# Patient Record
Sex: Female | Born: 1960 | Race: White | Hispanic: No | Smoking: Current every day smoker
Health system: Southern US, Community
[De-identification: ages and names within clinical notes are randomized; demographics above are authoritative.]

## PROBLEM LIST (undated history)

## (undated) DIAGNOSIS — B1920 Unspecified viral hepatitis C without hepatic coma: Secondary | ICD-10-CM

## (undated) DIAGNOSIS — F419 Anxiety disorder, unspecified: Secondary | ICD-10-CM

## (undated) DIAGNOSIS — F4310 Post-traumatic stress disorder, unspecified: Secondary | ICD-10-CM

## (undated) DIAGNOSIS — F32A Depression, unspecified: Secondary | ICD-10-CM

## (undated) DIAGNOSIS — M5030 Other cervical disc degeneration, unspecified cervical region: Secondary | ICD-10-CM

## (undated) DIAGNOSIS — K7460 Unspecified cirrhosis of liver: Secondary | ICD-10-CM

## (undated) DIAGNOSIS — F329 Major depressive disorder, single episode, unspecified: Secondary | ICD-10-CM

## (undated) DIAGNOSIS — M549 Dorsalgia, unspecified: Secondary | ICD-10-CM

## (undated) DIAGNOSIS — G4700 Insomnia, unspecified: Secondary | ICD-10-CM

## (undated) HISTORY — PX: APPENDECTOMY: SHX54

## (undated) HISTORY — PX: BREAST SURGERY: SHX581

## (undated) HISTORY — PX: CHOLECYSTECTOMY: SHX55

---

## 2015-08-03 ENCOUNTER — Encounter (HOSPITAL_BASED_OUTPATIENT_CLINIC_OR_DEPARTMENT_OTHER): Payer: Self-pay | Admitting: Emergency Medicine

## 2015-08-03 ENCOUNTER — Emergency Department (HOSPITAL_BASED_OUTPATIENT_CLINIC_OR_DEPARTMENT_OTHER): Payer: Medicaid Other

## 2015-08-03 ENCOUNTER — Emergency Department (HOSPITAL_BASED_OUTPATIENT_CLINIC_OR_DEPARTMENT_OTHER)
Admission: EM | Admit: 2015-08-03 | Discharge: 2015-08-03 | Disposition: A | Discharge status: Home / Self Care | Payer: Medicaid Other | Attending: Emergency Medicine | Admitting: Emergency Medicine

## 2015-08-03 DIAGNOSIS — Z79899 Other long term (current) drug therapy: Secondary | ICD-10-CM | POA: Insufficient documentation

## 2015-08-03 DIAGNOSIS — J441 Chronic obstructive pulmonary disease with (acute) exacerbation: Secondary | ICD-10-CM

## 2015-08-03 DIAGNOSIS — F17200 Nicotine dependence, unspecified, uncomplicated: Secondary | ICD-10-CM | POA: Diagnosis not present

## 2015-08-03 DIAGNOSIS — Z8719 Personal history of other diseases of the digestive system: Secondary | ICD-10-CM | POA: Diagnosis not present

## 2015-08-03 DIAGNOSIS — R05 Cough: Secondary | ICD-10-CM | POA: Diagnosis present

## 2015-08-03 DIAGNOSIS — G4700 Insomnia, unspecified: Secondary | ICD-10-CM | POA: Diagnosis not present

## 2015-08-03 DIAGNOSIS — Z8619 Personal history of other infectious and parasitic diseases: Secondary | ICD-10-CM | POA: Diagnosis not present

## 2015-08-03 DIAGNOSIS — Z8739 Personal history of other diseases of the musculoskeletal system and connective tissue: Secondary | ICD-10-CM | POA: Diagnosis not present

## 2015-08-03 HISTORY — DX: Unspecified cirrhosis of liver: K74.60

## 2015-08-03 HISTORY — DX: Insomnia, unspecified: G47.00

## 2015-08-03 HISTORY — DX: Dorsalgia, unspecified: M54.9

## 2015-08-03 HISTORY — DX: Unspecified viral hepatitis C without hepatic coma: B19.20

## 2015-08-03 LAB — CBC
HEMATOCRIT: 40.1 % (ref 36.0–46.0)
HEMOGLOBIN: 13.6 g/dL (ref 12.0–15.0)
MCH: 31.4 pg (ref 26.0–34.0)
MCHC: 33.9 g/dL (ref 30.0–36.0)
MCV: 92.6 fL (ref 78.0–100.0)
PLATELETS: 91 10*3/uL — AB (ref 150–400)
RBC: 4.33 MIL/uL (ref 3.87–5.11)
RDW: 13.3 % (ref 11.5–15.5)
WBC: 4.1 10*3/uL (ref 4.0–10.5)

## 2015-08-03 LAB — COMPREHENSIVE METABOLIC PANEL
ALT: 84 U/L — ABNORMAL HIGH (ref 14–54)
AST: 149 U/L — ABNORMAL HIGH (ref 15–41)
Albumin: 3.7 g/dL (ref 3.5–5.0)
Alkaline Phosphatase: 126 U/L (ref 38–126)
Anion gap: 5 (ref 5–15)
BUN: 19 mg/dL (ref 6–20)
CO2: 25 mmol/L (ref 22–32)
Calcium: 9.2 mg/dL (ref 8.9–10.3)
Chloride: 108 mmol/L (ref 101–111)
Creatinine, Ser: 0.86 mg/dL (ref 0.44–1.00)
GFR calc Af Amer: >60 mL/min (ref >60)
GFR calc non Af Amer: >60 mL/min (ref >60)
Glucose, Bld: 100 mg/dL — ABNORMAL HIGH (ref 65–99)
Potassium: 3.6 mmol/L (ref 3.5–5.1)
Sodium: 138 mmol/L (ref 135–145)
Total Bilirubin: 0.6 mg/dL (ref 0.3–1.2)
Total Protein: 8 g/dL (ref 6.5–8.1)

## 2015-08-03 MED ORDER — AZITHROMYCIN 250 MG PO TABS: ORAL_TABLET | ORAL | Status: DC

## 2015-08-03 MED ORDER — KETOROLAC TROMETHAMINE 30 MG/ML IJ SOLN
30.0000 mg | Freq: Once | INTRAMUSCULAR | Status: AC
  Administered 2015-08-03: 30 mg via INTRAVENOUS
  Filled 2015-08-03: qty 1

## 2015-08-03 MED ORDER — KETOROLAC TROMETHAMINE 15 MG/ML IJ SOLN
15.0000 mg | Freq: Once | INTRAMUSCULAR | Status: AC
  Administered 2015-08-03: 15 mg via INTRAVENOUS
  Filled 2015-08-03: qty 1

## 2015-08-03 MED ORDER — DEXAMETHASONE SODIUM PHOSPHATE 10 MG/ML IJ SOLN
10.0000 mg | Freq: Once | INTRAMUSCULAR | Status: AC
  Administered 2015-08-03: 10 mg via INTRAVENOUS
  Filled 2015-08-03: qty 1

## 2015-08-03 NOTE — ED Notes (Signed)
Patient states that she had had SOb and chest pain x 2 week. Reports that she thinks she may be getting diabetes because she likes candy a lot and she had been dizzy and lightheaded recently. Patient has gained over 70 lbs in 6 months, and has a large distended abdominal area that she reports is getting worse. The patient also reports that she is having lower back pain continously.

## 2015-08-03 NOTE — ED Notes (Signed)
MD at bedside. 

## 2015-08-03 NOTE — ED Notes (Signed)
Pt given d/c instructions as per chart. Rx x 1. Verbalizes understanding. No questions. 

## 2015-08-03 NOTE — ED Provider Notes (Signed)
CSN: [id number]     Arrival date & time 08/03/15  1716 History   None    Chief Complaint  Patient presents with  . Cough      HPI   55 year old female presenting for cough for approximately 1-2 months. Has been more productive over the last 2 weeks. She reports history of COPD for which she is on 3 L of oxygen at home. She takes no medication for this. She reports shortness of breath associated. She denies chest pain. Her Patient denies fevers, nausea, vomiting, diarrhea, headache, weakness. Otherwise patient has a history of chronic back pain. She feels that her back pain has been exacerbated by coughing. She typically has low back pain that feels that it has spread superiorly to her ribs. She thinks she broke a rib from coughing   Past Medical History  Diagnosis Date  . Back pain   . Insomnia   . Hepatitis C   . Cirrhosis [facility])    Past Surgical History  Procedure Laterality Date  . Cholecystectomy    . Appendectomy    . Breast surgery     History reviewed. No pertinent family history. Social History  Substance Use Topics  . Smoking status: Current Every Day Smoker  . Smokeless tobacco: None  . Alcohol Use: No   OB History    No data available     Review of Systems  Constitutional: Negative.   HENT: Negative.   Eyes: Negative.   Respiratory: Positive for cough and shortness of breath. Negative for apnea, choking, chest tightness, wheezing and stridor.   Cardiovascular: Negative.   Gastrointestinal: Negative.   Genitourinary: Negative.   Skin: Negative.       Allergies  Review of patient's allergies indicates no known allergies.  Home Medications   Prior to Admission medications   Medication Sig Start Date End Date Taking? Authorizing Provider  gabapentin (NEURONTIN) 300 MG capsule Take 300 mg by mouth 3 (three) times daily.   Yes Historical Provider, MD  trazodone (DESYREL) 300 MG tablet Take 300 mg by mouth at bedtime.   Yes Historical Provider, MD   azithromycin (ZITHROMAX) 250 MG tablet Take 500 mg on the first day of treatment, then 250 mg daily for 4 days 08/03/15   Bonney Aid, MD   BP 125/82 mmHg  Pulse 81  Temp(Src) 98.1 F (36.7 C) (Oral)  Resp 18  Ht  (1.626 m)  Wt 95.255 kg  BMI 36.03 kg/m2  SpO2 100% Physical Exam  Constitutional: She is oriented to person, place, and time. She appears well-developed.  HENT:  Head: Normocephalic and atraumatic.  Eyes: Conjunctivae and EOM are normal. Pupils are equal, round, and reactive to light.  Neck: Normal range of motion.  Cardiovascular: Normal rate, regular rhythm and normal heart sounds.   Pulmonary/Chest: Effort normal and breath sounds normal. No respiratory distress.  Abdominal: Soft. Bowel sounds are normal. She exhibits no distension. There is no tenderness.  Musculoskeletal: Normal range of motion.  Neurological: She is alert and oriented to person, place, and time.  Skin: Skin is warm and dry.    ED Course  Procedures (including critical care time) Labs Review Labs Reviewed  CBC - Abnormal; Notable for the following:    Platelets 91 (*)    All other components within normal limits  COMPREHENSIVE METABOLIC PANEL - Abnormal; Notable for the following:    Glucose, Bld 100 (*)    AST 149 (*)    ALT 84 (*)  All other components within normal limits    Imaging Review Dg Chest 2 View  08/03/2015  CLINICAL DATA:  Cough for 2 months, increased severity over the past 2 weeks EXAM: CHEST  2 VIEW COMPARISON:  None. FINDINGS: The heart size and mediastinal contours are within normal limits. Both lungs are clear. The visualized skeletal structures are unremarkable. IMPRESSION: No active cardiopulmonary disease. Electronically Signed   By: Alcide CleverMark  Lukens M.D.   On: 08/03/2015 18:54   I have personally reviewed and evaluated these images and lab results as part of my medical decision-making.   EKG Interpretation None      MDM   Final diagnoses:  Chronic  obstructive pulmonary disease with acute exacerbation (HCC)    55 y/o with history consistent with COPD exacerbation. Differential includes flu, pneumonia. However, Chest xray was negative for consolidation and she has not had fevers. History of cough with sputum production in setting of COPD history makes COPD exacerbation per GOLD criteria. Although she did endorse dyspnea hr oxygen saturations were normal on room air. However, will treat for likely COPD exacerbation with decadron x1 and a 5 day course of azithromycin. Return precautions were discussed and she was advised to follow with PCP in the next week   Ayah Cozzolino A. Kennon RoundsHaney MD, MS Family Medicine Resident PGY-2 Pager [phone]     Bonney AidAlyssa A Rayfield Beem, MD 08/03/15 [id number]  Nelva Nayobert Beaton, MD 08/04/15 [phone]

## 2015-08-03 NOTE — Discharge Instructions (Signed)
Your diagnosed with COPD exacerbation. Please take antibiotics as prescribed. If you have worsening shortness of breath, chest pain, fevers turned emergency department for evaluation Please follow-up with your PCP within the next week

## 2016-01-10 ENCOUNTER — Encounter (HOSPITAL_COMMUNITY): Payer: Self-pay | Admitting: *Deleted

## 2016-01-10 ENCOUNTER — Emergency Department (HOSPITAL_COMMUNITY)
Admission: EM | Admit: 2016-01-10 | Discharge: 2016-01-11 | Disposition: A | Discharge status: Other Acute Inpatient Hospital | Payer: Medicaid Other | Attending: Emergency Medicine | Admitting: Emergency Medicine

## 2016-01-10 DIAGNOSIS — X789XXA Intentional self-harm by unspecified sharp object, initial encounter: Secondary | ICD-10-CM | POA: Insufficient documentation

## 2016-01-10 DIAGNOSIS — T07XXXA Unspecified multiple injuries, initial encounter: Secondary | ICD-10-CM

## 2016-01-10 DIAGNOSIS — T1491 Suicide attempt: Secondary | ICD-10-CM | POA: Diagnosis not present

## 2016-01-10 DIAGNOSIS — Z23 Encounter for immunization: Secondary | ICD-10-CM | POA: Diagnosis not present

## 2016-01-10 DIAGNOSIS — Y999 Unspecified external cause status: Secondary | ICD-10-CM | POA: Diagnosis not present

## 2016-01-10 DIAGNOSIS — Z046 Encounter for general psychiatric examination, requested by authority: Secondary | ICD-10-CM | POA: Diagnosis present

## 2016-01-10 DIAGNOSIS — Y929 Unspecified place or not applicable: Secondary | ICD-10-CM | POA: Insufficient documentation

## 2016-01-10 DIAGNOSIS — F17200 Nicotine dependence, unspecified, uncomplicated: Secondary | ICD-10-CM | POA: Insufficient documentation

## 2016-01-10 DIAGNOSIS — Y939 Activity, unspecified: Secondary | ICD-10-CM | POA: Diagnosis not present

## 2016-01-10 DIAGNOSIS — Z7982 Long term (current) use of aspirin: Secondary | ICD-10-CM | POA: Diagnosis not present

## 2016-01-10 DIAGNOSIS — R45851 Suicidal ideations: Secondary | ICD-10-CM

## 2016-01-10 DIAGNOSIS — Z79899 Other long term (current) drug therapy: Secondary | ICD-10-CM | POA: Insufficient documentation

## 2016-01-10 DIAGNOSIS — S50812A Abrasion of left forearm, initial encounter: Secondary | ICD-10-CM | POA: Diagnosis not present

## 2016-01-10 DIAGNOSIS — T1491XA Suicide attempt, initial encounter: Secondary | ICD-10-CM

## 2016-01-10 HISTORY — DX: Major depressive disorder, single episode, unspecified: F32.9

## 2016-01-10 HISTORY — DX: Post-traumatic stress disorder, unspecified: F43.10

## 2016-01-10 HISTORY — DX: Other cervical disc degeneration, unspecified cervical region: M50.30

## 2016-01-10 HISTORY — DX: Depression, unspecified: F32.A

## 2016-01-10 HISTORY — DX: Anxiety disorder, unspecified: F41.9

## 2016-01-10 LAB — CBC WITH DIFFERENTIAL/PLATELET
BASOS PCT: 0 %
Basophils Absolute: 0 10*3/uL (ref 0.0–0.1)
Eosinophils Absolute: 0.2 10*3/uL (ref 0.0–0.7)
Eosinophils Relative: 4 %
HEMATOCRIT: 40.3 % (ref 36.0–46.0)
HEMOGLOBIN: 13.7 g/dL (ref 12.0–15.0)
LYMPHS ABS: 1.5 10*3/uL (ref 0.7–4.0)
LYMPHS PCT: 23 %
MCH: 31.2 pg (ref 26.0–34.0)
MCHC: 34 g/dL (ref 30.0–36.0)
MCV: 91.8 fL (ref 78.0–100.0)
MONO ABS: 0.7 10*3/uL (ref 0.1–1.0)
MONOS PCT: 11 %
NEUTROS ABS: 4 10*3/uL (ref 1.7–7.7)
NEUTROS PCT: 62 %
Platelets: 97 10*3/uL — ABNORMAL LOW (ref 150–400)
RBC: 4.39 MIL/uL (ref 3.87–5.11)
RDW: 13.5 % (ref 11.5–15.5)
WBC: 6.4 10*3/uL (ref 4.0–10.5)

## 2016-01-10 LAB — RAPID URINE DRUG SCREEN, HOSP PERFORMED
Amphetamines: NOT DETECTED
BARBITURATES: NOT DETECTED
Benzodiazepines: NOT DETECTED
COCAINE: NOT DETECTED
Opiates: NOT DETECTED
TETRAHYDROCANNABINOL: NOT DETECTED

## 2016-01-10 LAB — BASIC METABOLIC PANEL
Anion gap: 8 (ref 5–15)
BUN: 11 mg/dL (ref 6–20)
CHLORIDE: 109 mmol/L (ref 101–111)
CO2: 20 mmol/L — AB (ref 22–32)
CREATININE: 1.1 mg/dL — AB (ref 0.44–1.00)
Calcium: 9.1 mg/dL (ref 8.9–10.3)
GFR calc non Af Amer: 55 mL/min — ABNORMAL LOW (ref >60)
GLUCOSE: 115 mg/dL — AB (ref 65–99)
Potassium: 3.2 mmol/L — ABNORMAL LOW (ref 3.5–5.1)
Sodium: 137 mmol/L (ref 135–145)

## 2016-01-10 LAB — ETHANOL: Alcohol, Ethyl (B): 137 mg/dL — ABNORMAL HIGH (ref <5)

## 2016-01-10 MED ORDER — LORAZEPAM 1 MG PO TABS
1.0000 mg | ORAL_TABLET | Freq: Once | ORAL | Status: AC
  Administered 2016-01-10: 1 mg via ORAL

## 2016-01-10 MED ORDER — LORAZEPAM 1 MG PO TABS
ORAL_TABLET | ORAL | Status: AC
  Filled 2016-01-10: qty 1

## 2016-01-10 MED ORDER — ALUM & MAG HYDROXIDE-SIMETH 200-200-20 MG/5ML PO SUSP: 30.0000 mL | ORAL | Status: DC | PRN

## 2016-01-10 MED ORDER — GABAPENTIN 300 MG PO CAPS
300.0000 mg | ORAL_CAPSULE | Freq: Three times a day (TID) | ORAL | Status: DC
  Administered 2016-01-11 (×2): 300 mg via ORAL
  Filled 2016-01-10 (×2): qty 1

## 2016-01-10 MED ORDER — TRAZODONE HCL 50 MG PO TABS
300.0000 mg | ORAL_TABLET | Freq: Every day | ORAL | Status: DC
  Administered 2016-01-11: 300 mg via ORAL
  Filled 2016-01-10: qty 6

## 2016-01-10 MED ORDER — TETANUS-DIPHTH-ACELL PERTUSSIS 5-2.5-18.5 LF-MCG/0.5 IM SUSP
0.5000 mL | Freq: Once | INTRAMUSCULAR | Status: AC
  Administered 2016-01-10: 0.5 mL via INTRAMUSCULAR
  Filled 2016-01-10: qty 0.5

## 2016-01-10 MED ORDER — IBUPROFEN 400 MG PO TABS
600.0000 mg | ORAL_TABLET | Freq: Three times a day (TID) | ORAL | Status: DC | PRN
  Administered 2016-01-11: 600 mg via ORAL
  Filled 2016-01-10: qty 2

## 2016-01-10 MED ORDER — NICOTINE 21 MG/24HR TD PT24
21.0000 mg | MEDICATED_PATCH | Freq: Every day | TRANSDERMAL | Status: DC | PRN
  Administered 2016-01-11: 21 mg via TRANSDERMAL
  Filled 2016-01-10: qty 1

## 2016-01-10 MED ORDER — LORAZEPAM 1 MG PO TABS: 1.0000 mg | ORAL_TABLET | Freq: Three times a day (TID) | ORAL | Status: DC | PRN
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
## 2016-01-10 NOTE — ED Notes (Signed)
Tele psych in progress at this time. 
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
## 2016-01-10 NOTE — ED Notes (Addendum)
Pt cleaned up in room, patient repeated a few times that she's "glad she didn't have a knife tonight because..." and then trails off and will not finish sentence.

## 2016-01-11 ENCOUNTER — Inpatient Hospital Stay: Admission: EM | Admit: 2016-01-11 | Payer: Medicaid Other | Source: Intra-hospital | Admitting: Psychiatry

## 2016-01-11 ENCOUNTER — Encounter: Payer: Self-pay | Admitting: Psychiatry

## 2016-01-11 DIAGNOSIS — F17200 Nicotine dependence, unspecified, uncomplicated: Secondary | ICD-10-CM | POA: Diagnosis present

## 2016-01-11 DIAGNOSIS — F332 Major depressive disorder, recurrent severe without psychotic features: Secondary | ICD-10-CM | POA: Diagnosis present

## 2016-01-11 DIAGNOSIS — R45851 Suicidal ideations: Secondary | ICD-10-CM

## 2016-01-11 DIAGNOSIS — F1020 Alcohol dependence, uncomplicated: Secondary | ICD-10-CM | POA: Diagnosis present

## 2016-01-11 MED ORDER — LORAZEPAM 1 MG PO TABS: 0.0000 mg | ORAL_TABLET | Freq: Two times a day (BID) | ORAL | Status: DC

## 2016-01-11 MED ORDER — LORAZEPAM 1 MG PO TABS
0.0000 mg | ORAL_TABLET | Freq: Four times a day (QID) | ORAL | Status: DC
  Administered 2016-01-11: 2 mg via ORAL
  Administered 2016-01-11: 1 mg via ORAL
  Filled 2016-01-11: qty 2
  Filled 2016-01-11: qty 1

## 2016-01-11 MED ORDER — POTASSIUM CHLORIDE CRYS ER 20 MEQ PO TBCR
40.0000 meq | EXTENDED_RELEASE_TABLET | Freq: Once | ORAL | Status: AC
  Administered 2016-01-11: 40 meq via ORAL
  Filled 2016-01-11: qty 2

## 2016-01-11 MED ORDER — THIAMINE HCL 100 MG/ML IJ SOLN: 100.0000 mg | Freq: Every day | INTRAMUSCULAR | Status: DC

## 2016-01-11 MED ORDER — VITAMIN B-1 100 MG PO TABS
100.0000 mg | ORAL_TABLET | Freq: Every day | ORAL | Status: DC
  Administered 2016-01-11: 100 mg via ORAL
  Filled 2016-01-11: qty 1

## 2016-01-11 NOTE — Progress Notes (Signed)
[facility] by Dr. Otelia SanteeFarrah. Per Albin Fellingarla in intake, bed available 15:00 today. Requests that report be called at [phone] once transport has arrived for pt (pt under IVC).  Ilean SkillMeghan Geneva Pallas, MSW, LCSW Clinical Social Work, Disposition  01/11/2016 [phone]

## 2016-01-11 NOTE — ED Notes (Signed)
TTS at bedside. 

## 2016-01-11 NOTE — ED Notes (Signed)
Pt ambulatory. Pt walked to bathroom with no difficulties noted.

## 2016-01-11 NOTE — ED Notes (Signed)
Patient asleep at this time. [facility] sitting with patient.

## 2016-01-11 NOTE — BH Assessment (Addendum)
Pt presented as drowsy during reassement. Clinician informed by RN that pt has been administered ativan. Pt continues to endorse suicidal ideation with intent. Pt denies homicidal ideation. Pt reports auditory hallucinations without command of a voice calling her name. Clinician discussed inpatient admission process with pt.   Per Fransisca KaufmannLaura Davis, PA pt continues to meet criteria for inpatient admission.

## 2016-01-11 NOTE — ED Notes (Signed)
Pt awake at this time. Pt has breakfast tray at bedside.
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
## 2016-01-11 NOTE — ED Notes (Signed)
Patient reports that the reason she ran out of Lexapro is because " I took them all" when asked if she took them all at the same time she stated "yea, but it didn't work".

## 2016-01-11 NOTE — ED Provider Notes (Signed)
Patient has been in the ED for 24 hours. Nurses report patient is starting to have withdrawal from alcohol. They are requesting CIWA orders, which I have written.   Devoria AlbeIva Bryndan Bilyk, MD, Concha PyoFACEP    Britne Borelli, MD 01/11/16 [phone]

## 2016-01-11 NOTE — ED Notes (Signed)
Into check on patient, patient shaking, states she feels likes she is starting to go through withdrawals from alchol. Dr. Lynelle DoctorKnapp informed.

## 2016-01-11 NOTE — ED Notes (Signed)
Pt denies si/hi/halluciantions or hearing voices at thsi time

## 2016-02-14 ENCOUNTER — Encounter (HOSPITAL_COMMUNITY): Payer: Self-pay | Admitting: Cardiology

## 2016-02-14 ENCOUNTER — Emergency Department (HOSPITAL_COMMUNITY)
Admission: EM | Admit: 2016-02-14 | Discharge: 2016-02-14 | Disposition: A | Discharge status: Other Acute Inpatient Hospital | Payer: Medicaid Other | Attending: Emergency Medicine | Admitting: Emergency Medicine

## 2016-02-14 DIAGNOSIS — F17200 Nicotine dependence, unspecified, uncomplicated: Secondary | ICD-10-CM | POA: Insufficient documentation

## 2016-02-14 DIAGNOSIS — Z7982 Long term (current) use of aspirin: Secondary | ICD-10-CM | POA: Insufficient documentation

## 2016-02-14 DIAGNOSIS — F4310 Post-traumatic stress disorder, unspecified: Secondary | ICD-10-CM | POA: Insufficient documentation

## 2016-02-14 DIAGNOSIS — Z79899 Other long term (current) drug therapy: Secondary | ICD-10-CM | POA: Diagnosis not present

## 2016-02-14 DIAGNOSIS — R45851 Suicidal ideations: Secondary | ICD-10-CM

## 2016-02-14 DIAGNOSIS — F333 Major depressive disorder, recurrent, severe with psychotic symptoms: Secondary | ICD-10-CM | POA: Insufficient documentation

## 2016-02-14 LAB — COMPREHENSIVE METABOLIC PANEL
ALBUMIN: 3.5 g/dL (ref 3.5–5.0)
ALT: 34 U/L (ref 14–54)
ANION GAP: 6 (ref 5–15)
AST: 73 U/L — ABNORMAL HIGH (ref 15–41)
Alkaline Phosphatase: 166 U/L — ABNORMAL HIGH (ref 38–126)
BILIRUBIN TOTAL: 0.5 mg/dL (ref 0.3–1.2)
BUN: 16 mg/dL (ref 6–20)
CALCIUM: 9 mg/dL (ref 8.9–10.3)
CO2: 25 mmol/L (ref 22–32)
Chloride: 109 mmol/L (ref 101–111)
Creatinine, Ser: 0.86 mg/dL (ref 0.44–1.00)
Glucose, Bld: 143 mg/dL — ABNORMAL HIGH (ref 65–99)
POTASSIUM: 3.6 mmol/L (ref 3.5–5.1)
Sodium: 140 mmol/L (ref 135–145)
TOTAL PROTEIN: 8.3 g/dL — AB (ref 6.5–8.1)

## 2016-02-14 LAB — CBC WITH DIFFERENTIAL/PLATELET
BASOS ABS: 0 10*3/uL (ref 0.0–0.1)
BASOS PCT: 0 %
EOS ABS: 0.1 10*3/uL (ref 0.0–0.7)
Eosinophils Relative: 2 %
HEMATOCRIT: 39.7 % (ref 36.0–46.0)
HEMOGLOBIN: 13.7 g/dL (ref 12.0–15.0)
Lymphocytes Relative: 31 %
Lymphs Abs: 1.6 10*3/uL (ref 0.7–4.0)
MCH: 32.9 pg (ref 26.0–34.0)
MCHC: 34.5 g/dL (ref 30.0–36.0)
MCV: 95.4 fL (ref 78.0–100.0)
Monocytes Absolute: 0.6 10*3/uL (ref 0.1–1.0)
Monocytes Relative: 12 %
NEUTROS ABS: 2.9 10*3/uL (ref 1.7–7.7)
NEUTROS PCT: 56 %
Platelets: 83 10*3/uL — ABNORMAL LOW (ref 150–400)
RBC: 4.16 MIL/uL (ref 3.87–5.11)
RDW: 15.1 % (ref 11.5–15.5)
WBC: 5.2 10*3/uL (ref 4.0–10.5)

## 2016-02-14 LAB — ACETAMINOPHEN LEVEL: Acetaminophen (Tylenol), Serum: <10 ug/mL — ABNORMAL LOW (ref 10–30)

## 2016-02-14 LAB — RAPID URINE DRUG SCREEN, HOSP PERFORMED
Amphetamines: NOT DETECTED
BARBITURATES: NOT DETECTED
BENZODIAZEPINES: POSITIVE — AB
COCAINE: NOT DETECTED
OPIATES: NOT DETECTED
TETRAHYDROCANNABINOL: NOT DETECTED

## 2016-02-14 LAB — ETHANOL: ALCOHOL ETHYL (B): 172 mg/dL — AB (ref <5)

## 2016-02-14 LAB — SALICYLATE LEVEL: Salicylate Lvl: <4 mg/dL (ref 2.8–30.0)

## 2016-02-14 MED ORDER — LORAZEPAM 2 MG/ML IJ SOLN: 1.0000 mg | Freq: Once | INTRAMUSCULAR | Status: DC

## 2016-02-14 MED ORDER — ZOLPIDEM TARTRATE 5 MG PO TABS
5.0000 mg | ORAL_TABLET | Freq: Every evening | ORAL | Status: DC | PRN
  Administered 2016-02-14: 5 mg via ORAL
  Filled 2016-02-14: qty 1

## 2016-02-14 MED ORDER — ALUM & MAG HYDROXIDE-SIMETH 200-200-20 MG/5ML PO SUSP
30.0000 mL | ORAL | Status: DC | PRN
  Administered 2016-02-14: 30 mL via ORAL
  Filled 2016-02-14: qty 30

## 2016-02-14 MED ORDER — LORAZEPAM 1 MG PO TABS
1.0000 mg | ORAL_TABLET | Freq: Once | ORAL | Status: AC
  Administered 2016-02-14: 1 mg via ORAL
  Filled 2016-02-14: qty 1

## 2016-02-14 MED ORDER — NICOTINE 21 MG/24HR TD PT24
21.0000 mg | MEDICATED_PATCH | Freq: Every day | TRANSDERMAL | Status: DC
  Administered 2016-02-14: 21 mg via TRANSDERMAL
  Filled 2016-02-14: qty 1

## 2016-02-14 MED ORDER — LORAZEPAM 1 MG PO TABS
0.0000 mg | ORAL_TABLET | Freq: Four times a day (QID) | ORAL | Status: DC
  Administered 2016-02-14: 1 mg via ORAL
  Filled 2016-02-14: qty 1

## 2016-02-14 MED ORDER — LORAZEPAM 1 MG PO TABS: 0.0000 mg | ORAL_TABLET | Freq: Two times a day (BID) | ORAL | Status: DC

## 2016-02-14 MED ORDER — ONDANSETRON HCL 4 MG PO TABS
4.0000 mg | ORAL_TABLET | Freq: Three times a day (TID) | ORAL | Status: DC | PRN
  Administered 2016-02-14: 4 mg via ORAL
  Filled 2016-02-14: qty 1

## 2016-02-14 MED ORDER — ALBUTEROL SULFATE HFA 108 (90 BASE) MCG/ACT IN AERS
2.0000 | INHALATION_SPRAY | Freq: Once | RESPIRATORY_TRACT | Status: AC
  Administered 2016-02-14: 2 via RESPIRATORY_TRACT
  Filled 2016-02-14: qty 6.7

## 2016-02-14 MED ORDER — IBUPROFEN 400 MG PO TABS: 600.0000 mg | ORAL_TABLET | Freq: Three times a day (TID) | ORAL | Status: DC | PRN

## 2016-02-14 MED ORDER — DIPHENHYDRAMINE HCL 25 MG PO CAPS
25.0000 mg | ORAL_CAPSULE | Freq: Once | ORAL | Status: AC
  Administered 2016-02-14: 25 mg via ORAL
  Filled 2016-02-14: qty 1

## 2016-02-14 NOTE — ED Provider Notes (Addendum)
[facility]ERG[facility]Y DEPT Provider Note   CSN: [id number] Arrival date & time: 02/14/16  1151     History   Chief Complaint Chief Complaint  Patient presents with  . V70.1    HPI Donna Blanchard is a 55 y.o. female.  HPI  The pt has hx of anxiety, depression, PTSD and as of 5 weeks ago was seen in the ED for a complaint of depression and suicidal thought with intent - she was sent to [facility] unit - she presents today after her granddaughter called 911 stating that she was having increased suicidal thoughts and some threats  She is not hear to corroborate this story from EMS at this time - the pt was also admitted 2 months ago at [facility] for suicidality. - she has had attempts in the past.  The pt denies that she is suicidal at this time, she is here voluntarily and states that she has not had intent today though she does endorse that she made some comments to her grand daughter earlier today.  She does endorse alcohol use last night and hears voices constantly but can't tell what they're saying  - refuses to give me any other information at this time.  Past Medical History:  Diagnosis Date  . Anxiety   . Back pain   . Cirrhosis (HCC)   . DDD (degenerative disc disease), cervical   . Depression   . Hepatitis C   . Insomnia   . PTSD (post-traumatic stress disorder)     Patient Active Problem List   Diagnosis Date Noted  . Major depressive disorder, recurrent severe without psychotic features (HCC) 01/11/2016  . Alcohol use disorder, moderate, dependence (HCC) 01/11/2016  . Suicidal ideation 01/11/2016  . Tobacco use disorder 01/11/2016    Past Surgical History:  Procedure Laterality Date  . APPENDECTOMY    . BREAST SURGERY    . CHOLECYSTECTOMY      OB History    No data available       Home Medications    Prior to Admission medications   Medication Sig Start Date End Date Taking? Authorizing Provider  albuterol (PROVENTIL) 2 MG tablet Take 2 mg by mouth 3  (three) times daily as needed for wheezing or shortness of breath.  12/09/15  Yes Historical Provider, MD  ALPRAZolam (XANAX) 0.25 MG tablet Take 0.25 mg by mouth daily. 12/09/15  Yes Historical Provider, MD  Aspirin-Salicylamide-Caffeine (BC HEADACHE POWDER PO) Take 1 Package by mouth daily as needed (back pain).   Yes Historical Provider, MD  escitalopram (LEXAPRO) 10 MG tablet Take 10 mg by mouth daily. 12/09/15  Yes Historical Provider, MD  gabapentin (NEURONTIN) 300 MG capsule Take 300 mg by mouth 3 (three) times daily.   Yes Historical Provider, MD  hydrOXYzine (VISTARIL) 50 MG capsule Take 50 mg by mouth 3 (three) times daily as needed. 12/09/15  Yes Historical Provider, MD  nicotine (NICODERM CQ - DOSED IN MG/24 HOURS) 21 mg/24hr patch Place 1 patch onto the skin daily. 12/09/15  Yes Historical Provider, MD  trazodone (DESYREL) 300 MG tablet Take 300 mg by mouth at bedtime.   Yes Historical Provider, MD    Family History History reviewed. No pertinent family history.  Social History Social History  Substance Use Topics  . Smoking status: Current Every Day Smoker  . Smokeless tobacco: Never Used  . Alcohol use Yes     Comment: 3 40s     Allergies   Review of patient's allergies  indicates no known allergies.   Review of Systems Review of Systems  All other systems reviewed and are negative.    Physical Exam Updated Vital Signs BP 112/83 (BP Location: Right Arm)   Pulse 96   Temp 97.6 F (36.4 C) (Oral)   Resp 17   Ht 5\' 3"  (1.6 m)   Wt 215 lb (97.5 kg)   SpO2 98%   BMI 38.09 kg/m   Physical Exam  Constitutional: She appears well-developed and well-nourished. No distress.  HENT:  Head: Normocephalic and atraumatic.  Mouth/Throat: Oropharynx is clear and moist. No oropharyngeal exudate.  Eyes: Conjunctivae and EOM are normal. Pupils are equal, round, and reactive to light. Right eye exhibits no discharge. Left eye exhibits no discharge. No scleral icterus.  Neck:  Normal range of motion. Neck supple. No JVD present. No thyromegaly present.  Cardiovascular: Normal rate, regular rhythm, normal heart sounds and intact distal pulses.  Exam reveals no gallop and no friction rub.   No murmur heard. Pulmonary/Chest: Effort normal and breath sounds normal. No respiratory distress. She has no wheezes. She has no rales.  Abdominal: Soft. Bowel sounds are normal. She exhibits no distension and no mass. There is no tenderness.  Musculoskeletal: Normal range of motion. She exhibits no edema or tenderness.  Lymphadenopathy:    She has no cervical adenopathy.  Neurological: She is alert. Coordination normal.  Skin: Skin is warm and dry. No rash noted. No erythema.  Psychiatric: She has a normal mood and affect. Her behavior is normal.  Mildly agitated - clear speech, no hallucinations, denies suicidality.  Nursing note and vitals reviewed.    ED Treatments / Results  Labs (all labs ordered are listed, but only abnormal results are displayed) Labs Reviewed  COMPREHENSIVE METABOLIC PANEL - Abnormal; Notable for the following:       Result Value   Glucose, Bld 143 (*)    Total Protein 8.3 (*)    AST 73 (*)    Alkaline Phosphatase 166 (*)    All other components within normal limits  ETHANOL - Abnormal; Notable for the following:    Alcohol, Ethyl (B) 172 (*)    All other components within normal limits  CBC WITH DIFFERENTIAL/PLATELET - Abnormal; Notable for the following:    Platelets 83 (*)    All other components within normal limits  URINE RAPID DRUG SCREEN, HOSP PERFORMED - Abnormal; Notable for the following:    Benzodiazepines POSITIVE (*)    All other components within normal limits  ACETAMINOPHEN LEVEL - Abnormal; Notable for the following:    Acetaminophen (Tylenol), Serum <10 (*)    All other components within normal limits  SALICYLATE LEVEL     Radiology No results found.  Procedures Procedures (including critical care  time)  Medications Ordered in ED Medications  ibuprofen (ADVIL,MOTRIN) tablet 600 mg (not administered)  nicotine (NICODERM CQ - dosed in mg/24 hours) patch 21 mg (21 mg Transdermal Patch Applied 02/14/16 1329)  ondansetron (ZOFRAN) tablet 4 mg (4 mg Oral Given 02/14/16 1327)  zolpidem (AMBIEN) tablet 5 mg (not administered)  alum & mag hydroxide-simeth (MAALOX/MYLANTA) 200-200-20 MG/5ML suspension 30 mL (not administered)     Initial Impression / Assessment and Plan / ED Course  I have reviewed the triage vital signs and the nursing notes.  Pertinent labs & imaging results that were available during my care of the patient were reviewed by me and considered in my medical decision making (see chart for details).  Clinical  Course  Comment By Time  Platelets are low consistent with prior lab values. Eber HongBrian Erland Vivas, MD 10/01 1441   ETOH is high - evidence of ongoing ETOH heavy use Eber HongBrian Ashe Graybeal, MD 10/01 1441   Waiting to hear from family, sitter ordered Labs ordered TTS consult ordered  Granddaugher stated to TTS that the pt had tried to walk into traffic / get gun and voiced suicidality today (while intoxicated).  IVC completed  Change of shift - care signed over to Dr. Jeraldine LootsLockwood pending TTS placement  Final Clinical Impressions(s) / ED Diagnoses   Final diagnoses:  None    New Prescriptions New Prescriptions   No medications on file     Eber HongBrian Angelicia Lessner, MD 02/14/16 1212    Eber HongBrian Everlee Quakenbush, MD 02/14/16 1527

## 2016-02-14 NOTE — ED Notes (Addendum)
Transfer [facility] given to Donna [facility]Sarah [facility].  Pt going to unit 300 bed 2.

## 2016-02-14 NOTE — ED Notes (Signed)
IVC papers were faxed.  Waiting on official papers to be served to patient.
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
## 2016-02-14 NOTE — ED Triage Notes (Signed)
Granddaughter called EMS .  States that patient was threatening to hurt herself.  Pt has some superficial healing lacerations to left forearm.   Pt denies any HI or SI upon arrival to the ED.

## 2016-02-14 NOTE — ED Notes (Signed)
[facility]

## 2016-02-14 NOTE — [facility] Counselor (Signed)
[facility]  Accepting is T. Melvyn NethLewis, FNP.  Attending is Dr. Jama Flavorsobos.  Pt may be transported at any time.

## 2016-02-14 NOTE — ED Notes (Signed)
Pt given some meds for nausea and vomiting and is now resting.

## 2016-02-14 NOTE — ED Notes (Signed)
Pt eating at this time. Pt is calm and cooperative now. Warm blanket given.

## 2016-02-14 NOTE — ED Notes (Signed)
Pt given zofram PO for nausea and vomiting.

## 2016-02-14 NOTE — [facility] Counselor (Signed)
Recommend inpatient; advised hospital to petition for IVC if Pt attempts to leave

## 2016-02-15 ENCOUNTER — Encounter (HOSPITAL_COMMUNITY): Payer: Self-pay

## 2016-02-15 ENCOUNTER — Inpatient Hospital Stay (HOSPITAL_COMMUNITY)
Admission: AD | Admit: 2016-02-15 | Discharge: 2016-02-18 | DRG: 897 | Disposition: A | Discharge status: Home / Self Care | Payer: Medicaid Other | Attending: Psychiatry | Admitting: Psychiatry

## 2016-02-15 DIAGNOSIS — Z79899 Other long term (current) drug therapy: Secondary | ICD-10-CM

## 2016-02-15 DIAGNOSIS — B1920 Unspecified viral hepatitis C without hepatic coma: Secondary | ICD-10-CM | POA: Diagnosis present

## 2016-02-15 DIAGNOSIS — Z9889 Other specified postprocedural states: Secondary | ICD-10-CM

## 2016-02-15 DIAGNOSIS — F1024 Alcohol dependence with alcohol-induced mood disorder: Secondary | ICD-10-CM | POA: Diagnosis present

## 2016-02-15 DIAGNOSIS — F419 Anxiety disorder, unspecified: Secondary | ICD-10-CM | POA: Diagnosis present

## 2016-02-15 DIAGNOSIS — F10229 Alcohol dependence with intoxication, unspecified: Secondary | ICD-10-CM | POA: Diagnosis present

## 2016-02-15 DIAGNOSIS — F10239 Alcohol dependence with withdrawal, unspecified: Secondary | ICD-10-CM | POA: Diagnosis not present

## 2016-02-15 DIAGNOSIS — F4312 Post-traumatic stress disorder, chronic: Secondary | ICD-10-CM | POA: Diagnosis present

## 2016-02-15 DIAGNOSIS — F1020 Alcohol dependence, uncomplicated: Secondary | ICD-10-CM | POA: Diagnosis present

## 2016-02-15 DIAGNOSIS — F17200 Nicotine dependence, unspecified, uncomplicated: Secondary | ICD-10-CM | POA: Diagnosis present

## 2016-02-15 DIAGNOSIS — F1994 Other psychoactive substance use, unspecified with psychoactive substance-induced mood disorder: Secondary | ICD-10-CM | POA: Diagnosis present

## 2016-02-15 DIAGNOSIS — G4700 Insomnia, unspecified: Secondary | ICD-10-CM | POA: Diagnosis present

## 2016-02-15 DIAGNOSIS — K7460 Unspecified cirrhosis of liver: Secondary | ICD-10-CM | POA: Diagnosis present

## 2016-02-15 MED ORDER — LORAZEPAM 1 MG PO TABS
1.0000 mg | ORAL_TABLET | Freq: Three times a day (TID) | ORAL | Status: AC
  Administered 2016-02-16 (×3): 1 mg via ORAL
  Filled 2016-02-15 (×4): qty 1

## 2016-02-15 MED ORDER — ALUM & MAG HYDROXIDE-SIMETH 200-200-20 MG/5ML PO SUSP: 30.0000 mL | ORAL | Status: DC | PRN

## 2016-02-15 MED ORDER — TRAZODONE HCL 50 MG PO TABS: 50.0000 mg | ORAL_TABLET | Freq: Every evening | ORAL | Status: DC | PRN

## 2016-02-15 MED ORDER — ADULT MULTIVITAMIN W/MINERALS CH
1.0000 | ORAL_TABLET | Freq: Every day | ORAL | Status: DC
  Administered 2016-02-15 – 2016-02-18 (×4): 1 via ORAL
  Filled 2016-02-15 (×6): qty 1

## 2016-02-15 MED ORDER — LORAZEPAM 1 MG PO TABS
1.0000 mg | ORAL_TABLET | Freq: Four times a day (QID) | ORAL | Status: AC | PRN
  Administered 2016-02-15 – 2016-02-17 (×5): 1 mg via ORAL
  Filled 2016-02-15 (×5): qty 1

## 2016-02-15 MED ORDER — ACETAMINOPHEN 325 MG PO TABS
650.0000 mg | ORAL_TABLET | Freq: Four times a day (QID) | ORAL | Status: DC | PRN
  Administered 2016-02-17: 650 mg via ORAL
  Filled 2016-02-15 (×2): qty 2

## 2016-02-15 MED ORDER — LOPERAMIDE HCL 2 MG PO CAPS: 2.0000 mg | ORAL_CAPSULE | ORAL | Status: AC | PRN

## 2016-02-15 MED ORDER — THIAMINE HCL 100 MG/ML IJ SOLN
100.0000 mg | Freq: Once | INTRAMUSCULAR | Status: AC
  Administered 2016-02-15: 100 mg via INTRAMUSCULAR
  Filled 2016-02-15: qty 2

## 2016-02-15 MED ORDER — ALBUTEROL SULFATE HFA 108 (90 BASE) MCG/ACT IN AERS
2.0000 | INHALATION_SPRAY | RESPIRATORY_TRACT | Status: DC | PRN
  Administered 2016-02-17 (×3): 2 via RESPIRATORY_TRACT
  Filled 2016-02-15: qty 6.7

## 2016-02-15 MED ORDER — LORAZEPAM 1 MG PO TABS
1.0000 mg | ORAL_TABLET | Freq: Every day | ORAL | Status: AC
  Administered 2016-02-18: 1 mg via ORAL
  Filled 2016-02-15: qty 1

## 2016-02-15 MED ORDER — CAPSAICIN 0.025 % EX CREA
TOPICAL_CREAM | Freq: Two times a day (BID) | CUTANEOUS | Status: DC | PRN
  Administered 2016-02-15 (×2): via TOPICAL
  Filled 2016-02-15: qty 56.6

## 2016-02-15 MED ORDER — HYDROXYZINE HCL 25 MG PO TABS
25.0000 mg | ORAL_TABLET | Freq: Four times a day (QID) | ORAL | Status: AC | PRN
  Administered 2016-02-15 – 2016-02-17 (×2): 25 mg via ORAL
  Filled 2016-02-15 (×2): qty 1

## 2016-02-15 MED ORDER — MUSCLE RUB 10-15 % EX CREA: TOPICAL_CREAM | CUTANEOUS | Status: DC | PRN

## 2016-02-15 MED ORDER — ESCITALOPRAM OXALATE 10 MG PO TABS
10.0000 mg | ORAL_TABLET | Freq: Every day | ORAL | Status: DC
  Administered 2016-02-15 – 2016-02-18 (×4): 10 mg via ORAL
  Filled 2016-02-15 (×5): qty 1

## 2016-02-15 MED ORDER — VITAMIN B-1 100 MG PO TABS
100.0000 mg | ORAL_TABLET | Freq: Every day | ORAL | Status: DC
  Administered 2016-02-16 – 2016-02-18 (×3): 100 mg via ORAL
  Filled 2016-02-15 (×4): qty 1

## 2016-02-15 MED ORDER — GABAPENTIN 100 MG PO CAPS
100.0000 mg | ORAL_CAPSULE | Freq: Three times a day (TID) | ORAL | Status: DC
  Administered 2016-02-15 – 2016-02-16 (×3): 100 mg via ORAL
  Filled 2016-02-15 (×6): qty 1

## 2016-02-15 MED ORDER — MAGNESIUM HYDROXIDE 400 MG/5ML PO SUSP: 30.0000 mL | Freq: Every day | ORAL | Status: DC | PRN

## 2016-02-15 MED ORDER — TRAZODONE HCL 50 MG PO TABS
50.0000 mg | ORAL_TABLET | Freq: Every evening | ORAL | Status: DC | PRN
  Administered 2016-02-15 – 2016-02-17 (×4): 50 mg via ORAL
  Filled 2016-02-15 (×10): qty 1

## 2016-02-15 MED ORDER — LORAZEPAM 1 MG PO TABS
1.0000 mg | ORAL_TABLET | Freq: Four times a day (QID) | ORAL | Status: AC
  Administered 2016-02-15 (×4): 1 mg via ORAL
  Filled 2016-02-15 (×3): qty 1

## 2016-02-15 MED ORDER — NICOTINE 21 MG/24HR TD PT24
21.0000 mg | MEDICATED_PATCH | Freq: Every day | TRANSDERMAL | Status: DC
  Administered 2016-02-15 – 2016-02-18 (×4): 21 mg via TRANSDERMAL
  Filled 2016-02-15 (×6): qty 1

## 2016-02-15 MED ORDER — LORAZEPAM 1 MG PO TABS
1.0000 mg | ORAL_TABLET | Freq: Two times a day (BID) | ORAL | Status: AC
  Administered 2016-02-17: 1 mg via ORAL
  Filled 2016-02-15 (×2): qty 1

## 2016-02-15 MED ORDER — ONDANSETRON 4 MG PO TBDP
4.0000 mg | ORAL_TABLET | Freq: Four times a day (QID) | ORAL | Status: AC | PRN
  Administered 2016-02-15: 4 mg via ORAL
  Filled 2016-02-15: qty 1

## 2016-02-15 NOTE — [facility] Group Notes (Addendum)
[facility] L[facility] Group Therapy Note  Date/Time: 02/15/2016   1:30PM  Type of Therapy and Topic:  Group Therapy:  Who Am I?  Self Esteem, Self-Actualization and Understanding Self.  Participation Level:  Minimal  Description of Group:    In this group patients will be asked to explore values, beliefs, truths, and morals as they relate to personal self.  Patients will be guided to discuss their thoughts, feelings, and behaviors related to what they identify as important to their true self. Patients will process together how values, beliefs and truths are connected to specific choices patients make every day. Each patient will be challenged to identify changes that they are motivated to make in order to improve self-esteem and self-actualization. This group will be process-oriented, with patients participating in exploration of their own experiences as well as giving and receiving support and challenge from other group members.  Therapeutic Goals: 1. Patient will identify false beliefs that currently interfere with their self-esteem.  2. Patient will identify feelings, thought process, and behaviors related to self and will become aware of the uniqueness of themselves and of others.  3. Patient will be able to identify and verbalize values, morals, and beliefs as they relate to self. 4. Patient will begin to learn how to build self-esteem/self-awareness by expressing what is important and unique to them personally.  Summary of Patient Progress  Patient did not participate in discussion despite encouragement and left group early without returning.    Therapeutic Modalities:   Cognitive Behavioral Therapy Solution Focused Therapy Motivational Interviewing Brief Therapy   Samuella BruinKristin Marialuisa Basara, LCSW Clinical Social Worker [facility] [phone]

## 2016-02-15 NOTE — Progress Notes (Signed)
Recreation Therapy Notes  Date: 02/15/16  Time: 0930 Location: 300 Hall Dayroom  Group Topic: Stress Management  Goal Area(s) Addresses:  Patient will verbalize importance of using healthy stress management.  Patient will identify positive emotions associated with healthy stress management.   Behavioral Response: Engaged  Intervention: Guided Imagery  Activity :  Engineer, technical saleseaceful Place Imagery.  LRT introduced to the technique of guided imagery to patients.  LRT read a script so patients could participate in the technique.  Patients were to follow along as LRT read script.  Education:  Stress Management, Discharge Planning.   Education Outcome: Acknowledges edcuation/In group clarification offered/Needs additional education  Clinical Observations/Feedback: Pt attended group.     Caroll RancherMarjette Rubbie Goostree, LRT/CTRS         Caroll RancherLindsay, Gladine Plude A 02/15/2016 11:45 AM

## 2016-02-15 NOTE — [facility] Suicide Risk Assessment (Signed)
[facility]   Nursing information obtained from:   pt Demographic factors:   Caucasian, lower socioeconomic status Current Mental Status:   C mental status exam Loss Factors:   health Historical Factors:   substance use disorder history of abuse Risk Reduction Factors:   desire for improvement responsibility to family  Total Time spent with patient: 30 minutes Principal Problem: Severe alcohol use disorder (HCC) Diagnosis:   Patient Active Problem List   Diagnosis Date Noted  . Substance induced mood disorder (HCC) [F19.94] 02/15/2016  . Chronic post-traumatic stress disorder (PTSD) [F43.12] 02/15/2016  . Major depressive disorder, recurrent severe without psychotic features (HCC) [F33.2] 01/11/2016  . Severe alcohol use disorder (HCC) [F10.20] 01/11/2016  . Suicidal ideation [R45.851] 01/11/2016  . Tobacco use disorder [F17.200] 01/11/2016   Subjective Data: Denies current acute suicidal or homicidal ideation, plan or intent  Continued Clinical Symptoms:  Alcohol Use Disorder Identification Test Final Score (AUDIT): 33 The "Alcohol Use Disorders Identification Test", Guidelines for Use in Primary Care, Second Edition.  World Science writerHealth Organization [facility](WHO). Score between 0-7:  no or low risk or alcohol related problems. Score between 8-15:  moderate risk of alcohol related problems. Score between 16-19:  high risk of alcohol related problems. Score 20 or above:  warrants further diagnostic evaluation for alcohol dependence and treatment.   CLINICAL FACTORS:   Alcohol/Substance Abuse/Dependencies   Musculoskeletal: Strength & Muscle Tone: within normal limits Gait & Station: normal Patient leans: N/A  Psychiatric Specialty Exam: Physical Exam  ROS  Blood pressure 128/81, pulse 100, temperature 97.8 F (36.6 C), temperature source Oral, resp. rate 16, height 5\' 3"  (1.6 m), weight 99.8 kg (220 lb), SpO2 100 %.Body mass index is 38.97 kg/m.  General  Appearance: Disheveled  Eye Contact:  Good  Speech:  Clear and Coherent  Volume:  Normal  Mood:  Anxious  Affect:  Congruent  Thought Process:  Coherent  Orientation:  Full (Time, Place, and Person)  Thought Content:  Negative  Suicidal Thoughts:  No  Homicidal Thoughts:  No  Memory:  Negative  Judgement:  Fair  Insight:  Fair  Psychomotor Activity:  Normal  Concentration:  Concentration: Good and Attention Span: Good  Recall:  Good  Fund of Knowledge:  Good  Language:  Good  Akathisia:  No  Handed:  Right  AIMS (if indicated):     Assets:  Desire for Improvement Resilience  ADL's:  Intact  Cognition:  WNL  Sleep:  Number of Hours: 3.5      COGNITIVE FEATURES THAT CONTRIBUTE TO RISK:  None    SUICIDE RISK:   Moderate:  Frequent suicidal ideation with limited intensity, and duration, some specificity in terms of plans, no associated intent, good self-control, limited dysphoria/symptomatology, some risk factors present, and identifiable protective factors, including available and accessible social support.   PLAN OF CARE: See PAA  I certify that inpatient services furnished can reasonably be expected to improve the patient's condition.  Acquanetta SitElizabeth Woods Oates, MD 02/15/2016, 12:02 PM

## 2016-02-15 NOTE — Progress Notes (Signed)
The patient attended this evening's A.A.meeting and was appropriate.  

## 2016-02-15 NOTE — Tx Team (Signed)
Initial Treatment Plan 02/15/2016 2:53 AM Zissy Thaw WGN:[id number]    PATIENT STRESSORS: Health problems Marital or family conflict Substance abuse   PATIENT STRENGTHS: Ability for insight Capable of independent living General fund of knowledge   PATIENT IDENTIFIED PROBLEMS: Substance (alcohol) abuse  Depression  Suicide Risk  "quit this drinking"  "get my depression under control"             DISCHARGE CRITERIA:  Ability to meet basic life and health needs Improved stabilization in mood, thinking, and/or behavior Withdrawal symptoms are absent or subacute and managed without 24-hour nursing intervention  PRELIMINARY DISCHARGE PLAN: Attend 12-step recovery group Return to previous living arrangement  PATIENT/FAMILY INVOLVEMENT: This treatment plan has been presented to and reviewed with the patient, Donna Blanchard .The patient and family have been given the opportunity to ask questions and make suggestions.  Aurora Maskwyman, Gitel Beste E, RN 02/15/2016, 2:53 AM
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
## 2016-02-15 NOTE — Progress Notes (Signed)
Nursing Note: 0700-1900  D:  Pt presents with depressed mood and flat affect, she has remained in her room for most of the shift.  Remains on CIWA scale and receiving Ativan as ordered.  Pt c/o back pain and wanted Ibuprofen.  Spoke with MD, pt has elevated LFT's and slightly low platelets, decision make to add Neurontin and Capsaicin cream to affected area for pain management. Pt did not fill out her Self inventory today.  A:  Encouraged to verbalize needs and concerns, active listening and support provided.  Continued Q 15 minute safety checks.    R:  Pt. Is cooperative and respectful to staff.  Denies A/V hallucinations and is able to verbally contract for safety at this time.

## 2016-02-16 MED ORDER — GABAPENTIN 300 MG PO CAPS
900.0000 mg | ORAL_CAPSULE | Freq: Three times a day (TID) | ORAL | Status: DC
  Administered 2016-02-16 – 2016-02-18 (×6): 900 mg via ORAL
  Filled 2016-02-16 (×8): qty 3

## 2016-02-16 NOTE — Progress Notes (Signed)
Patient ID: Donna Blanchard, female   DOB: 03-10-1961, 55 y.o.   MRN: [id number]  D: Patient in dayroom watching TV. Pt mood and affect appeared depressed and flat.  Pt c/o multiple withdrawal symptoms but stated tolerating medication well. Pt denies SI/HI/AVH. Cooperative with assessment.   A: Medications administered as prescribed. Support and encouragement provided as needed. Pt encouraged to discuss feelings and come to staff with any question or concerns.   R: Patient remains safe and complaint with medications. Pt attended and participated in evening wrap up group.

## 2016-02-16 NOTE — [facility] Group Notes (Signed)
[facility] LCSW Group Therapy 02/16/2016 1:15 PM Type of Therapy: Group Therapy Participation Level: Active  Participation Quality: Minimal  Affect: Depressed and Lethargic  Cognitive: Alert and Oriented  Insight: Developing/Improving and Engaged  Engagement in Therapy: Developing/Improving and Engaged  Modes of Intervention: Activity, Clarification, Confrontation, Discussion, Education, Exploration, Limit-setting, Orientation, Problem-solving, Rapport Building, Dance movement psychotherapisteality Testing, Socialization and Support  Summary of Progress/Problems: Patient was attentive and engaged with speaker from [employer]. Patient was attentive to speaker while they shared their story of dealing with mental health and overcoming it. Patient expressed interest in their programs and services and received information on their agency. Patient processed ways they can relate to the speaker. Patient was observed sleeping during group. She left early and did not return.  Samuella BruinKristin Jamacia Jester, LCSW Clinical Social Worker [facility] [phone]

## 2016-02-16 NOTE — Progress Notes (Signed)
[facility]  02/16/2016 1:42 PM  Patient Active Problem List   Diagnosis Date Noted  . Substance induced mood disorder (HCC) 02/15/2016  . Chronic post-traumatic stress disorder (PTSD) 02/15/2016  . Major depressive disorder, recurrent severe without psychotic features (HCC) 01/11/2016  . Severe alcohol use disorder (HCC) 01/11/2016  . Suicidal ideation 01/11/2016  . Tobacco use disorder 01/11/2016    Diagnosis: Alcohol use disorder severe, depression  Subjective: Donna Blanchard reports that her mood remains moderately depressed and she feels she may be experiencing some withdrawal symptoms. she reports that she typically takes 900 mg 3 times a day of gabapentin and would not feel there was any problem simply resuming that dose. She did deny any current suicidal or homicidal ideation, plan or intent. We discussed that right now her physical conditions limit the pain medications we can use and therefore she will have access to BenGay and Zostrix and she indicates her understanding and appreciation of this.  Objective: Well-developed well-nourished woman who appears a bit tired and is currently lying down in bed in no apparent distress when ambulating later she is noted to have no noticeable's speech or motor abnormalities. Thought process is linear and goal directed. Thought content denies any current suicidal or homicidal ideation, plan or intent denies any psychotic symptoms. Alert and oriented 3 insight and judgment are fair IQ peers in average range       Current Facility-Administered Medications (Respiratory):  .  albuterol (PROVENTIL HFA;VENTOLIN HFA) 108 (90 Base) MCG/ACT inhaler 2 puff   Current Facility-Administered Medications (Analgesics):  .  acetaminophen (TYLENOL) tablet 650 mg     Current Facility-Administered Medications (Other):  .  alum & mag hydroxide-simeth (MAALOX/MYLANTA) 200-200-20 MG/5ML suspension 30 mL .  capsaicin (ZOSTRIX) 0.025 % cream .   escitalopram (LEXAPRO) tablet 10 mg .  gabapentin (NEURONTIN) capsule 900 mg .  hydrOXYzine (ATARAX/VISTARIL) tablet 25 mg .  loperamide (IMODIUM) capsule 2-4 mg .  LORazepam (ATIVAN) tablet 1 mg .  [COMPLETED] LORazepam (ATIVAN) tablet 1 mg **FOLLOWED BY** LORazepam (ATIVAN) tablet 1 mg **FOLLOWED BY** [START ON 02/17/2016] LORazepam (ATIVAN) tablet 1 mg **FOLLOWED BY** [START ON 02/18/2016] LORazepam (ATIVAN) tablet 1 mg .  magnesium hydroxide (MILK OF MAGNESIA) suspension 30 mL .  multivitamin with minerals tablet 1 tablet .  MUSCLE RUB CREA .  nicotine (NICODERM CQ - dosed in mg/24 hours) patch 21 mg .  ondansetron (ZOFRAN-ODT) disintegrating tablet 4 mg .  thiamine (VITAMIN B-1) tablet 100 mg .  traZODone (DESYREL) tablet 50 mg  No current outpatient prescriptions on file.  Vital Signs:Blood pressure 112/67, pulse 81, temperature 97.4 F (36.3 C), temperature source Oral, resp. rate 16, height 5\' 3"  (1.6 m), weight 99.8 kg (220 lb), SpO2 98 %.    Lab Results: No results found for this or any previous visit (from the past 48 hour(s)).  Physical Findings: AIMS: Facial and Oral Movements Muscles of Facial Expression: None, normal Lips and Perioral Area: None, normal Jaw: None, normal Tongue: None, normal,Extremity Movements Upper (arms, wrists, hands, fingers): None, normal Lower (legs, knees, ankles, toes): None, normal, Trunk Movements Neck, shoulders, hips: None, normal, Overall Severity Severity of abnormal movements (highest score from questions above): None, normal Incapacitation due to abnormal movements: None, normal Patient's awareness of abnormal movements (rate only patient's report): No Awareness, Dental Status Current problems with teeth and/or dentures?: Yes Does patient usually wear dentures?: Yes  CIWA:  CIWA-Ar Total: 7 COWS:      Assessment/Plan: Patient denies  any current side effects from medications. She appears to be experiencing some withdrawal symptoms  but overall tolerating medically supervised detox well. We will continue to monitor and adjust medications as needed. We will increase her gabapentin back to 900 mg by mouth 3 times a day and observe how she tolerates it today. The patient reports that she is interested in possibly following up with an outpatient treatment program for substance use disorder once her detox is completed.  Donna Sit, MD 02/16/2016, 1:42 PM

## 2016-02-16 NOTE — Plan of Care (Signed)
Problem: Safety: Goal: Ability to remain free from injury will improve Outcome: Progressing Pt denies SI and has remained free from self injury this shift

## 2016-02-16 NOTE — Tx Team (Signed)
Interdisciplinary Treatment and Diagnostic Plan Update  02/16/2016 Time of Session: 11:06 AM  Donna Blanchard MRN: [id number]  Principal Diagnosis: Severe alcohol use disorder (HCC)  Secondary Diagnoses: Principal Problem:   Severe alcohol use disorder (HCC) Active Problems:   Substance induced mood disorder (HCC)   Chronic post-traumatic stress disorder (PTSD)   Current Medications:  Current Facility-Administered Medications  Medication Dose Route Frequency Provider Last Rate Last Dose  . acetaminophen (TYLENOL) tablet 650 mg  650 mg Oral Q6H PRN Jackelyn Poling, NP      . albuterol (PROVENTIL HFA;VENTOLIN HFA) 108 (90 Base) MCG/ACT inhaler 2 puff  2 puff Inhalation Q4H PRN Acquanetta Sit, MD      . alum & mag hydroxide-simeth (MAALOX/MYLANTA) 200-200-20 MG/5ML suspension 30 mL  30 mL Oral Q4H PRN Jackelyn Poling, NP      . capsaicin (ZOSTRIX) 0.025 % cream   Topical BID PRN Acquanetta Sit, MD      . escitalopram (LEXAPRO) tablet 10 mg  10 mg Oral Daily Acquanetta Sit, MD   10 mg at 02/16/16 4098  . gabapentin (NEURONTIN) capsule 100 mg  100 mg Oral TID Acquanetta Sit, MD   100 mg at 02/16/16 1191  . hydrOXYzine (ATARAX/VISTARIL) tablet 25 mg  25 mg Oral Q6H PRN Jackelyn Poling, NP   25 mg at 02/15/16 0145  . loperamide (IMODIUM) capsule 2-4 mg  2-4 mg Oral PRN Jackelyn Poling, NP      . LORazepam (ATIVAN) tablet 1 mg  1 mg Oral Q6H PRN Jackelyn Poling, NP   1 mg at 02/15/16 0145  . LORazepam (ATIVAN) tablet 1 mg  1 mg Oral TID Jackelyn Poling, NP   1 mg at 02/16/16 0807   Followed by  . [START ON 02/17/2016] LORazepam (ATIVAN) tablet 1 mg  1 mg Oral BID Jackelyn Poling, NP       Followed by  . [START ON 02/18/2016] LORazepam (ATIVAN) tablet 1 mg  1 mg Oral Daily Jackelyn Poling, NP      . magnesium hydroxide (MILK OF MAGNESIA) suspension 30 mL  30 mL Oral Daily PRN Jackelyn Poling, NP      . multivitamin with minerals tablet 1 tablet  1 tablet Oral Daily Jackelyn Poling, NP   1  tablet at 02/16/16 0807  . MUSCLE RUB CREA   Topical PRN Acquanetta Sit, MD      . nicotine (NICODERM CQ - dosed in mg/24 hours) patch 21 mg  21 mg Transdermal Daily Craige Cotta, MD   21 mg at 02/16/16 0808  . ondansetron (ZOFRAN-ODT) disintegrating tablet 4 mg  4 mg Oral Q6H PRN Jackelyn Poling, NP   4 mg at 02/15/16 0647  . thiamine (VITAMIN B-1) tablet 100 mg  100 mg Oral Daily Jackelyn Poling, NP   100 mg at 02/16/16 0807  . traZODone (DESYREL) tablet 50 mg  50 mg Oral QHS,MR X 1 Jackelyn Poling, NP   50 mg at 02/15/16 2131    PTA Medications: Prescriptions Prior to Admission  Medication Sig Dispense Refill Last Dose  . albuterol (PROVENTIL) 2 MG tablet Take 2 mg by mouth 3 (three) times daily as needed for wheezing or shortness of breath.    unknown  . ALPRAZolam (XANAX) 0.25 MG tablet Take 0.25 mg by mouth daily.   Past Month at Unknown time  . Aspirin-Salicylamide-Caffeine (BC HEADACHE POWDER PO) Take 1 Package  by mouth daily as needed (back pain).   unknown  . escitalopram (LEXAPRO) 10 MG tablet Take 10 mg by mouth daily.   Past Month at Unknown time  . gabapentin (NEURONTIN) 300 MG capsule Take 300 mg by mouth 3 (three) times daily.   02/13/2016 at Unknown time  . hydrOXYzine (VISTARIL) 50 MG capsule Take 50 mg by mouth 3 (three) times daily as needed.   Past Month at Unknown time  . nicotine (NICODERM CQ - DOSED IN MG/24 HOURS) 21 mg/24hr patch Place 1 patch onto the skin daily.   unknown  . trazodone (DESYREL) 300 MG tablet Take 300 mg by mouth at bedtime.   Past Month at Unknown time    Treatment Modalities: Medication Management, Group therapy, Case management,  1 to 1 session with clinician, Psychoeducation, Recreational therapy.   Physician Treatment Plan for Primary Diagnosis: Severe alcohol use disorder (HCC) Long Term Goal(s): Improvement in symptoms so as ready for discharge  Short Term Goals: Ability to disclose and discuss suicidal ideas, Ability to maintain  clinical measurements within normal limits will improve and Compliance with prescribed medications will improve  Medication Management: Evaluate patient's response, side effects, and tolerance of medication regimen.  Therapeutic Interventions: 1 to 1 sessions, Unit Group sessions and Medication administration.  Evaluation of Outcomes: Progressing  Physician Treatment Plan for Secondary Diagnosis: Principal Problem:   Severe alcohol use disorder (HCC) Active Problems:   Substance induced mood disorder (HCC)   Chronic post-traumatic stress disorder (PTSD)   Long Term Goal(s): Improvement in symptoms so as ready for discharge  Short Term Goals: Ability to identify changes in lifestyle to reduce recurrence of condition will improve, Ability to identify and develop effective coping behaviors will improve and Ability to identify triggers associated with substance abuse/mental health issues will improve  Medication Management: Evaluate patient's response, side effects, and tolerance of medication regimen.  Therapeutic Interventions: 1 to 1 sessions, Unit Group sessions and Medication administration.  Evaluation of Outcomes: Progressing   RN Treatment Plan for Primary Diagnosis: Severe alcohol use disorder (HCC) Long Term Goal(s): Knowledge of disease and therapeutic regimen to maintain health will improve  Short Term Goals: Ability to remain free from injury will improve and Ability to participate in decision making will improve  Medication Management: RN will administer medications as ordered by provider, will assess and evaluate patient's response and provide education to patient for prescribed medication. RN will report any adverse and/or side effects to prescribing provider.  Therapeutic Interventions: 1 on 1 counseling sessions, Psychoeducation, Medication administration, Evaluate responses to treatment, Monitor vital signs and CBGs as ordered, Perform/monitor CIWA, COWS, AIMS and Fall  Risk screenings as ordered, Perform wound care treatments as ordered.  Evaluation of Outcomes: Progressing   LCSW Treatment Plan for Primary Diagnosis: Severe alcohol use disorder (HCC) Long Term Goal(s): Safe transition to appropriate next level of care at discharge, Engage patient in therapeutic group addressing interpersonal concerns.  Short Term Goals: Engage patient in aftercare planning with referrals and resources, Increase social support and Increase ability to appropriately verbalize feelings  Therapeutic Interventions: Assess for all discharge needs, 1 to 1 time with Social worker, Explore available resources and support systems, Assess for adequacy in community support network, Educate family and significant other(s) on suicide prevention, Complete Psychosocial Assessment, Interpersonal group therapy.  Evaluation of Outcomes: Progressing   Progress in Treatment: Attending groups: Yes Participating in groups: Yes Taking medication as prescribed: Yes, MD continues to assess for medication changes as needed  Toleration medication: Yes, no side effects reported at this time Family/Significant other contact made:  Patient understands diagnosis:  Discussing patient identified problems/goals with staff: Yes Medical problems stabilized or resolved: Yes Denies suicidal/homicidal ideation:  Issues/concerns per patient self-inventory: None Other: N/A  New problem(s) identified: None identified at this time.   New Short Term/Long Term Goal(s): None identified at this time.   Discharge Plan or Barriers: LCSW is actively engaging in aftercare planning.  Reason for Continuation of Hospitalization: Anxiety Delusions  Depression Hallucinations Homicidal ideation Man[place] Medical Issues Medication stabilization Suicidal ideation Withdrawal symptoms  Estimated Length of Stay: 3-5 days  Attendees: Patient: Donna Blanchard 02/16/2016  11:06 AM  Physician:  Dr. Mckinley Jewelates 02/16/2016  11:06 AM   Nursing:   Marton Redwoodoni 02/16/2016  11:06 AM  RN Care Manager:  Victorino DikeJennifer 02/16/2016  11:06 AM  Social Worker: Mordecai RasmussenHannah Zimir Kittleson 02/16/2016  11:06 AM  Recreational Therapist:  02/16/2016  11:06 AM  Other:  02/16/2016  11:06 AM  Other:  02/16/2016  11:06 AM  Other: 02/16/2016  11:06 AM    Scribe for Treatment Team:  Mordecai RasmussenHannah Susumu Hackler, LCSW

## 2016-02-16 NOTE — [facility] Suicide Risk Assessment (Addendum)
[facility] INPATIENT:  Family/Significant Other Suicide Prevention Education  Suicide Prevention Education:  Education Completed; Donna Blanchard  [phone].  has been identified by the patient as the family member/significant other with whom the patient will be residing, and identified as the person(s) who will aid the patient in the event of a mental health crisis (suicidal ideations/suicide attempt).  With written consent from the patient, the family member/significant other has been provided the following suicide prevention education, prior to the and/or following the discharge of the patient.  The suicide prevention education provided includes the following:  Suicide risk factors  Suicide prevention and interventions  National Suicide Hotline telephone number  [facility] assessment telephone number  [facility] Emergency Assistance 911  [facility] and/or [facility]idential Mobile Crisis Unit telephone number  Request made of family/significant other to:  Remove weapons (e.g., guns, rifles, knives), all items previously/currently identified as safety concern.    Remove drugs/medications (over-the-counter, prescriptions, illicit drugs), all items previously/currently identified as a safety concern.  The family member/significant other verbalizes understanding of the suicide prevention education information provided.  The family member/significant other agrees to remove the items of safety concern listed above.  Donna Blanchard, Donna Blanchard 02/16/2016, 10:22 AM
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
## 2016-02-16 NOTE — Progress Notes (Signed)
Recreation Therapy Notes  Animal-Assisted Activity (AAA) Program Checklist/Progress Notes Patient Eligibility Criteria Checklist & Daily Group note for Rec TxIntervention  Date: 10.03.2017 Time: 2:45pm Location: [address]    AAA/T Program Assumption of Risk Form signed by Patient/ or Parent Legal Guardian Yes  Patient is free of allergies or sever asthma Yes  Patient reports no fear of animals Yes  Patient reports no history of cruelty to animals Yes  Patient understands his/her participation is voluntary Yes  Behavioral Response: Did not attend.   Marykay Lexenise L Iya Hamed, LRT/CTRS         Kameela Leipold L 02/16/2016 3:04 PM

## 2016-02-16 NOTE — [facility] Group Notes (Signed)
Donna Blanchard attend group. Donna Blanchard's day was a 7.She wants the  social worker to help her. She do not want to drink anymore. She wants to have a good outlook on life.

## 2016-02-16 NOTE — Progress Notes (Signed)
Patient ID: Donna Blanchard, female   DOB: 1961/02/04, 55 y.o.   MRN: [id number]    Pt currently presents with a flat affect and depressed behavior. Pt reports to writer that their goal is to "get through these withdrawals." Pt states "I am doing about the same as a couple of days ago." Pt reports poor sleep with current medication regimen. Pt reports nausea, generalized aches and anxiety as signs of withdrawal. Pt has an episode of coughing tonight, reports "it happens sometimes."  Pt provided with scheduled and as needed medications per providers orders. Pt's labs and vitals were monitored throughout the night. Pt supported emotionally and encouraged to express concerns and questions. Pt educated on medications. See CIWA assessments.   Pt's safety ensured with 15 minute and environmental checks. Pt currently denies SI/HI and A/V hallucinations. Pt verbally agrees to seek staff if SI/HI or A/VH occurs and to consult with staff before acting on any harmful thoughts. Pt reports no difficulty breathing currently. Will continue POC.

## 2016-02-17 NOTE — [facility] Group Notes (Signed)
[facility] LCSW Group Therapy  02/17/2016 3:36 PM  Type of Therapy:  Group Therapy  Participation Level:  Minimal  Participation Quality:  Supportive  Affect:  Flat  Cognitive:  Alert, Appropriate and Oriented  Insight:  Limited  Engagement in Therapy:  Poor  Modes of Intervention:  Discussion, Education and Exploration  Summary of Progress/Problems:   Group consisted of a discussion around emotional regulation.  Discussion consisted of understanding and defining 2 aspects of the emotional life:  Emotional experience (what you feel like on the inside) and the emotional expression (how you show your emotions in how you talk, behave, or gesture).    Donna Blanchard did participate in group, but more to support others rather than engage with her own stressors and identify how she regulates emotions and deals with situations.  She was able to answer questions towards the beginning, but did not engage much more after that.    Donna Blanchard, Donna Blanchard N 02/17/2016, 3:36 PM

## 2016-02-17 NOTE — Progress Notes (Signed)
Patient ID: Donna Blanchard, female   DOB: February 26, 1961, 55 y.o.   MRN: [id number]   Pt currently presents with a flat affect and anxious behavior. Pt reports to writer that their goal is to "feel better." Pt reports symptoms of withdrawal including nausea, tremors and generalized aches. Pt reports good sleep with current medication regimen.   Pt provided with scheduled and as needed medications per providers orders. Pt's labs and vitals were monitored throughout the night. Pt supported emotionally and encouraged to express concerns and questions. Pt educated on medications.  Pt's safety ensured with 15 minute and environmental checks. Pt currently denies SI/HI and A/V hallucinations. Pt verbally agrees to seek staff if SI/HI or A/VH occurs and to consult with staff before acting on any harmful thoughts. Will continue POC.

## 2016-02-17 NOTE — Plan of Care (Signed)
Problem: Education: Goal: Knowledge of the prescribed therapeutic regimen will improve Outcome: Progressing Patient was able to take her medications as prescribed this shift, with minimal prompting.

## 2016-02-17 NOTE — Progress Notes (Signed)
Recreation Therapy Notes  Date: 02/17/16 Time: 0930 Location: [address] Group Room  Group Topic: Stress Management  Goal Area(s) Addresses:  Patient will verbalize importance of using healthy stress management.  Patient will identify positive emotions associated with healthy stress management.   Intervention: Stress Management  Activity :  Progressive Muscle Relaxation.  LRT introduced the technique of progressive muscle relaxation to the group.  LRT read script to engage patients in the technique.  Patients were to follow along as LRT read a script to participate in activity.  Education:  Stress Management, Discharge Planning.   Education Outcome: Acknowledges edcuation/In group clarification offered/Needs additional education  Clinical Observations/Feedback: Pt did not attend group.     Scarleth Brame, LRT/CTRS         Random Dobrowski A 02/17/2016 11:54 AM 

## 2016-02-17 NOTE — Progress Notes (Signed)
[facility] MD Progress Note  02/17/2016 12:36 PM  Patient Active Problem List   Diagnosis Date Noted  . Substance induced mood disorder (HCC) 02/15/2016  . Chronic post-traumatic stress disorder (PTSD) 02/15/2016  . Major depressive disorder, recurrent severe without psychotic features (HCC) 01/11/2016  . Severe alcohol use disorder (HCC) 01/11/2016  . Suicidal ideation 01/11/2016  . Tobacco use disorder 01/11/2016    Diagnosis:Alcohol use disorder severe with withdrawal uncomplicated  Subjective: The patient reports that she is still having some withdrawal symptoms, upset stomach and feeling shaky. The parameters of her Ativan detox were discussed and explained to her. She is concerned about a court date today and social work has already faxed over a letter to the court house to let them know she is here. She denies current suicidal or homicidal ideation, plan or intent.  Objective: Well developed well nourished female in no apparent distress. Reasonably groomed and appropriate. Speech and motor appear within normal limits without visible tremor. Thought processes linear and goal-directed thought content denies any auditory or visual hallucinations, denies any suicidal or homicidal ideation, plan or intent. Alert and oriented 3 insight and judgment are fair. IQ appears an average range       Current Facility-Administered Medications (Respiratory):  .  albuterol (PROVENTIL HFA;VENTOLIN HFA) 108 (90 Base) MCG/ACT inhaler 2 puff   Current Facility-Administered Medications (Analgesics):  .  acetaminophen (TYLENOL) tablet 650 mg     Current Facility-Administered Medications (Other):  .  alum & mag hydroxide-simeth (MAALOX/MYLANTA) 200-200-20 MG/5ML suspension 30 mL .  capsaicin (ZOSTRIX) 0.025 % cream .  escitalopram (LEXAPRO) tablet 10 mg .  gabapentin (NEURONTIN) capsule 900 mg .  hydrOXYzine (ATARAX/VISTARIL) tablet 25 mg .  loperamide (IMODIUM) capsule 2-4 mg .  LORazepam (ATIVAN)  tablet 1 mg .  [COMPLETED] LORazepam (ATIVAN) tablet 1 mg **FOLLOWED BY** [COMPLETED] LORazepam (ATIVAN) tablet 1 mg **FOLLOWED BY** LORazepam (ATIVAN) tablet 1 mg **FOLLOWED BY** [START ON 02/18/2016] LORazepam (ATIVAN) tablet 1 mg .  magnesium hydroxide (MILK OF MAGNESIA) suspension 30 mL .  multivitamin with minerals tablet 1 tablet .  MUSCLE RUB CREA .  nicotine (NICODERM CQ - dosed in mg/24 hours) patch 21 mg .  ondansetron (ZOFRAN-ODT) disintegrating tablet 4 mg .  thiamine (VITAMIN B-1) tablet 100 mg .  traZODone (DESYREL) tablet 50 mg  No current outpatient prescriptions on file.  Vital Signs:Blood pressure 124/61, pulse 85, temperature 97.3 F (36.3 C), temperature source Oral, resp. rate 18, height 5\' 3"  (1.6 m), weight 99.8 kg (220 lb), SpO2 99 %.    Lab Results: No results found for this or any previous visit (from the past 48 hour(s)).  Physical Findings: AIMS: Facial and Oral Movements Muscles of Facial Expression: None, normal Lips and Perioral Area: None, normal Jaw: None, normal Tongue: None, normal,Extremity Movements Upper (arms, wrists, hands, fingers): None, normal Lower (legs, knees, ankles, toes): None, normal, Trunk Movements Neck, shoulders, hips: None, normal, Overall Severity Severity of abnormal movements (highest score from questions above): None, normal Incapacitation due to abnormal movements: None, normal Patient's awareness of abnormal movements (rate only patient's report): No Awareness, Dental Status Current problems with teeth and/or dentures?: Yes Does patient usually wear dentures?: Yes  CIWA:  CIWA-Ar Total: 10 COWS:      Assessment/Plan: Continue with medically supervised detox from alcohol and monitor response and adjust medications as needed. After completion of detox recommend that the patient pursue further treatment for alcohol use disorder and also remain in follow-up for her mood  issues.  Acquanetta SitElizabeth Woods Oates, MD 02/17/2016, 12:36  PM

## 2016-02-17 NOTE — Progress Notes (Addendum)
Data. Patient denies SI/HI/AVH. Patient interacting well with staff and other patients. Patient affect is blunt and she reports her mood as anxious. Patient has been very demanding this shift, up at the med window asking for PRNs often. C/O pain and anxiety. Patient gave her self assessment to staff very late in the shift, after being encouraged to do so multiple times. She reports 5/10 for depression and hopelessness and 6/10 for anxiety. Her goal for today is: "staying sober". Action. PRNs given for pain and anxiety. Emotional support and encouragement offered. Education provided on medication, indications and side effect. Q 15 minute checks done for safety. Response. Safety on the unit maintained through 15 minute checks.  Medications taken as prescribed. Attended groups.

## 2016-02-18 MED ORDER — HYDROXYZINE PAMOATE 50 MG PO CAPS: 50.0000 mg | ORAL_CAPSULE | Freq: Three times a day (TID) | ORAL | 0 refills | Status: AC | PRN

## 2016-02-18 MED ORDER — GABAPENTIN 300 MG PO CAPS: 900.0000 mg | ORAL_CAPSULE | Freq: Three times a day (TID) | ORAL | 0 refills | Status: AC

## 2016-02-18 MED ORDER — ALBUTEROL SULFATE HFA 108 (90 BASE) MCG/ACT IN AERS: 2.0000 | INHALATION_SPRAY | RESPIRATORY_TRACT | Status: AC | PRN

## 2016-02-18 MED ORDER — LOPERAMIDE HCL 2 MG PO CAPS: 2.0000 mg | ORAL_CAPSULE | ORAL | Status: DC | PRN

## 2016-02-18 MED ORDER — HYDROXYZINE HCL 25 MG PO TABS
25.0000 mg | ORAL_TABLET | Freq: Four times a day (QID) | ORAL | Status: DC | PRN
  Administered 2016-02-18: 25 mg via ORAL
  Filled 2016-02-18: qty 1

## 2016-02-18 MED ORDER — MUSCLE RUB 10-15 % EX CREA: 1.0000 "application " | TOPICAL_CREAM | CUTANEOUS | 0 refills | Status: AC | PRN

## 2016-02-18 MED ORDER — CAPSAICIN 0.025 % EX CREA: TOPICAL_CREAM | Freq: Two times a day (BID) | CUTANEOUS | 0 refills | Status: AC | PRN

## 2016-02-18 MED ORDER — LORAZEPAM 1 MG PO TABS: 1.0000 mg | ORAL_TABLET | Freq: Four times a day (QID) | ORAL | Status: DC | PRN

## 2016-02-18 MED ORDER — TRAZODONE HCL 50 MG PO TABS: 50.0000 mg | ORAL_TABLET | Freq: Every evening | ORAL | 0 refills | Status: AC | PRN

## 2016-02-18 MED ORDER — ESCITALOPRAM OXALATE 10 MG PO TABS: 10.0000 mg | ORAL_TABLET | Freq: Every day | ORAL | 0 refills | Status: AC

## 2016-02-18 MED ORDER — NICOTINE 21 MG/24HR TD PT24: 21.0000 mg | MEDICATED_PATCH | Freq: Every day | TRANSDERMAL | 0 refills | Status: AC

## 2016-02-18 NOTE — Progress Notes (Signed)
Nursing Note 02/18/2016 [id number]  Data Reports sleeping good with PRN sleep med.  Rates depression 5/10, hopelessness 0/10, and anxiety 5/10. Affect blunted but appropriate. Denies HI, SI, AVH.  Attended group this AM, interacting with peers.  Received discharge orders.    Action Spoke with patient 1:1, nurse offered support to patient throughout shift.  Reviewed medications, discharge instructions, and follow up appointments with patient.   Medication scripts handed to patient.  Paperwork, AVS, SRA, and transition record handed to patient.   Escorted off of unit at 1345. Belongings returned per belongings form.  Discharged to lobby where she was met by her family.      Response Agrees to contact someone or 911 if she has thoughts/intent to harm self or others. To follow up per AVS.
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
## 2016-02-18 NOTE — Progress Notes (Signed)
  [facility] Discharge Plan :  Will you be returning to the same living situation after discharge:  Yes,  return home At discharge, do you have transportation home?: Yes,  family/friends Do you have the ability to pay for your medications: Yes,  no concerns expressed, referral facility has options for low/no cost medications  Release of information consent forms completed and in the chart;  Patient's signature needed at discharge.  Patient to Follow up at: Follow-up Information    [facility] [facility]) Follow up on 02/25/2016.   Why:  Appointment for follow up:  medication/therapy.  Please arrive 15 minutes before appointment.  Come in at 8:00am.   You can walk in at any time, however you will have to fill out paperwork and to limit time to wait, it is best if you arrive early. Contact information:  [address], [place], [place] [address] Phone: [phone]          Next level of care provider has access to [facility] Link:no  Safety Planning and Suicide Prevention discussed: Yes,  w Delaney Meigsamara, friend/support  Have you used any form of tobacco in the last 30 days? (Cigarettes, Smokeless Tobacco, Cigars, and/or Pipes): Yes  Has patient been referred to the Quitline?: Patient refused referral  Patient has been referred for addiction treatment: Yes  Sallee Langenne C Cunningham 02/18/2016, 11:02 AM

## 2016-02-18 NOTE — [facility] Group Notes (Signed)
Patient attend group NA 
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
## 2016-02-18 NOTE — Tx Team (Signed)
Interdisciplinary Treatment and Diagnostic Plan Update  02/18/2016 Time of Session: 10:59 AM  Donna Blanchard MRN: [id number]  Principal Diagnosis: Severe alcohol use disorder (HCC)  Secondary Diagnoses: Principal Problem:   Severe alcohol use disorder (HCC) Active Problems:   Substance induced mood disorder (HCC)   Chronic post-traumatic stress disorder (PTSD)   Current Medications:  Current Facility-Administered Medications  Medication Dose Route Frequency Provider Last Rate Last Dose  . acetaminophen (TYLENOL) tablet 650 mg  650 mg Oral Q6H PRN Jackelyn Poling, NP   650 mg at 02/17/16 4098  . albuterol (PROVENTIL HFA;VENTOLIN HFA) 108 (90 Base) MCG/ACT inhaler 2 puff  2 puff Inhalation Q4H PRN Acquanetta Sit, MD   2 puff at 02/17/16 2154  . alum & mag hydroxide-simeth (MAALOX/MYLANTA) 200-200-20 MG/5ML suspension 30 mL  30 mL Oral Q4H PRN Jackelyn Poling, NP      . capsaicin (ZOSTRIX) 0.025 % cream   Topical BID PRN Acquanetta Sit, MD      . escitalopram (LEXAPRO) tablet 10 mg  10 mg Oral Daily Acquanetta Sit, MD   10 mg at 02/18/16 0815  . gabapentin (NEURONTIN) capsule 900 mg  900 mg Oral TID Acquanetta Sit, MD   900 mg at 02/18/16 0815  . hydrOXYzine (ATARAX/VISTARIL) tablet 25 mg  25 mg Oral Q6H PRN Acquanetta Sit, MD      . loperamide (IMODIUM) capsule 2-4 mg  2-4 mg Oral PRN Acquanetta Sit, MD      . LORazepam (ATIVAN) tablet 1 mg  1 mg Oral Q6H PRN Acquanetta Sit, MD      . magnesium hydroxide (MILK OF MAGNESIA) suspension 30 mL  30 mL Oral Daily PRN Jackelyn Poling, NP      . multivitamin with minerals tablet 1 tablet  1 tablet Oral Daily Jackelyn Poling, NP   1 tablet at 02/18/16 0815  . MUSCLE RUB CREA   Topical PRN Acquanetta Sit, MD      . nicotine (NICODERM CQ - dosed in mg/24 hours) patch 21 mg  21 mg Transdermal Daily Craige Cotta, MD   21 mg at 02/18/16 0816  . thiamine (VITAMIN B-1) tablet 100 mg  100 mg Oral Daily  Jackelyn Poling, NP   100 mg at 02/18/16 0815  . traZODone (DESYREL) tablet 50 mg  50 mg Oral QHS,MR X 1 Jackelyn Poling, NP   50 mg at 02/17/16 2155    PTA Medications: Prescriptions Prior to Admission  Medication Sig Dispense Refill Last Dose  . albuterol (PROVENTIL) 2 MG tablet Take 2 mg by mouth 3 (three) times daily as needed for wheezing or shortness of breath.    unknown  . ALPRAZolam (XANAX) 0.25 MG tablet Take 0.25 mg by mouth daily.   Past Month at Unknown time  . Aspirin-Salicylamide-Caffeine (BC HEADACHE POWDER PO) Take 1 Package by mouth daily as needed (back pain).   unknown  . escitalopram (LEXAPRO) 10 MG tablet Take 10 mg by mouth daily.   Past Month at Unknown time  . gabapentin (NEURONTIN) 300 MG capsule Take 300 mg by mouth 3 (three) times daily.   02/13/2016 at Unknown time  . nicotine (NICODERM CQ - DOSED IN MG/24 HOURS) 21 mg/24hr patch Place 1 patch onto the skin daily.   unknown  . trazodone (DESYREL) 300 MG tablet Take 300 mg by mouth at bedtime.   Past Month at Unknown time  . [DISCONTINUED] hydrOXYzine (VISTARIL) 50  MG capsule Take 50 mg by mouth 3 (three) times daily as needed.   Past Month at Unknown time    Treatment Modalities: Medication Management, Group therapy, Case management,  1 to 1 session with clinician, Psychoeducation, Recreational therapy.   Physician Treatment Plan for Primary Diagnosis: Severe alcohol use disorder (HCC) Long Term Goal(s): Improvement in symptoms so as ready for discharge  Short Term Goals: Ability to disclose and discuss suicidal ideas, Ability to maintain clinical measurements within normal limits will improve and Compliance with prescribed medications will improve  Medication Management: Evaluate patient's response, side effects, and tolerance of medication regimen.  Therapeutic Interventions: 1 to 1 sessions, Unit Group sessions and Medication administration.  Evaluation of Outcomes: Adequate for Discharge  Physician  Treatment Plan for Secondary Diagnosis: Principal Problem:   Severe alcohol use disorder (HCC) Active Problems:   Substance induced mood disorder (HCC)   Chronic post-traumatic stress disorder (PTSD)   Long Term Goal(s): Improvement in symptoms so as ready for discharge  Short Term Goals: Ability to identify changes in lifestyle to reduce recurrence of condition will improve, Ability to identify and develop effective coping behaviors will improve and Ability to identify triggers associated with substance abuse/mental health issues will improve  Medication Management: Evaluate patient's response, side effects, and tolerance of medication regimen.  Therapeutic Interventions: 1 to 1 sessions, Unit Group sessions and Medication administration.  Evaluation of Outcomes: Adequate for Discharge   RN Treatment Plan for Primary Diagnosis: Severe alcohol use disorder (HCC) Long Term Goal(s): Knowledge of disease and therapeutic regimen to maintain health will improve  Short Term Goals: Ability to remain free from injury will improve and Ability to participate in decision making will improve  Medication Management: RN will administer medications as ordered by provider, will assess and evaluate patient's response and provide education to patient for prescribed medication. RN will report any adverse and/or side effects to prescribing provider.  Therapeutic Interventions: 1 on 1 counseling sessions, Psychoeducation, Medication administration, Evaluate responses to treatment, Monitor vital signs and CBGs as ordered, Perform/monitor CIWA, COWS, AIMS and Fall Risk screenings as ordered, Perform wound care treatments as ordered.  Evaluation of Outcomes: Adequate for Discharge   LCSW Treatment Plan for Primary Diagnosis: Severe alcohol use disorder (HCC) Long Term Goal(s): Safe transition to appropriate next level of care at discharge, Engage patient in therapeutic group addressing interpersonal  concerns.  Short Term Goals: Engage patient in aftercare planning with referrals and resources, Increase social support and Increase ability to appropriately verbalize feelings  Therapeutic Interventions: Assess for all discharge needs, 1 to 1 time with Social worker, Explore available resources and support systems, Assess for adequacy in community support network, Educate family and significant other(s) on suicide prevention, Complete Psychosocial Assessment, Interpersonal group therapy.  Evaluation of Outcomes: Adequate for Discharge   Progress in Treatment: Attending groups: Yes Participating in groups: Yes Taking medication as prescribed: Yes, MD continues to assess for medication changes as needed Toleration medication: Yes, no side effects reported at this time Family/Significant other contact made: Delaney Meigs (patient support) [phone] Patient understands diagnosis: Yes Discussing patient identified problems/goals with staff: Yes Medical problems stabilized or resolved: Yes Denies suicidal/homicidal ideation:  Issues/concerns per patient self-inventory: None Other: N/A  New problem(s) identified: None identified at this time.   New Short Term/Long Term Goal(s): None identified at this time.   Discharge Plan or Barriers: None  Reason for Continuation of Hospitalization: Anxiety Delusions  Depression Hallucinations Homicidal ideation [place] Medical Issues Medication stabilization Suicidal  ideation Withdrawal symptoms  Estimated Length of Stay: Dc today  Attendees: Patient: P 02/18/2016  10:59 AM  Physician:  Dr. Mckinley Jewelates 02/18/2016  10:59 AM  Nursing:   Marton Redwoodoni RN 02/18/2016  10:59 AM  RN Care Manager:  Darden DatesJennifer C RN CM 02/18/2016  10:59 AM  Social Worker: Santa GeneraAnne Afifa Truax LCSW 02/18/2016  10:59 AM  Recreational Therapist:  02/18/2016  10:59 AM  Other:  02/18/2016  10:59 AM  Other:  02/18/2016  10:59 AM  Other: 02/18/2016  10:59 AM    Scribe for Treatment Team:  Santa GeneraAnne Ellee Wawrzyniak,  LCSW

## 2016-09-13 IMAGING — CR DG CHEST 2V
2 series · 2 of 2 positions shown · non-contrast
Comparison: None.

CLINICAL DATA: Cough for 2 months, increased severity over the past
2 weeks

EXAM:
CHEST  2 VIEW

[w chest pa]
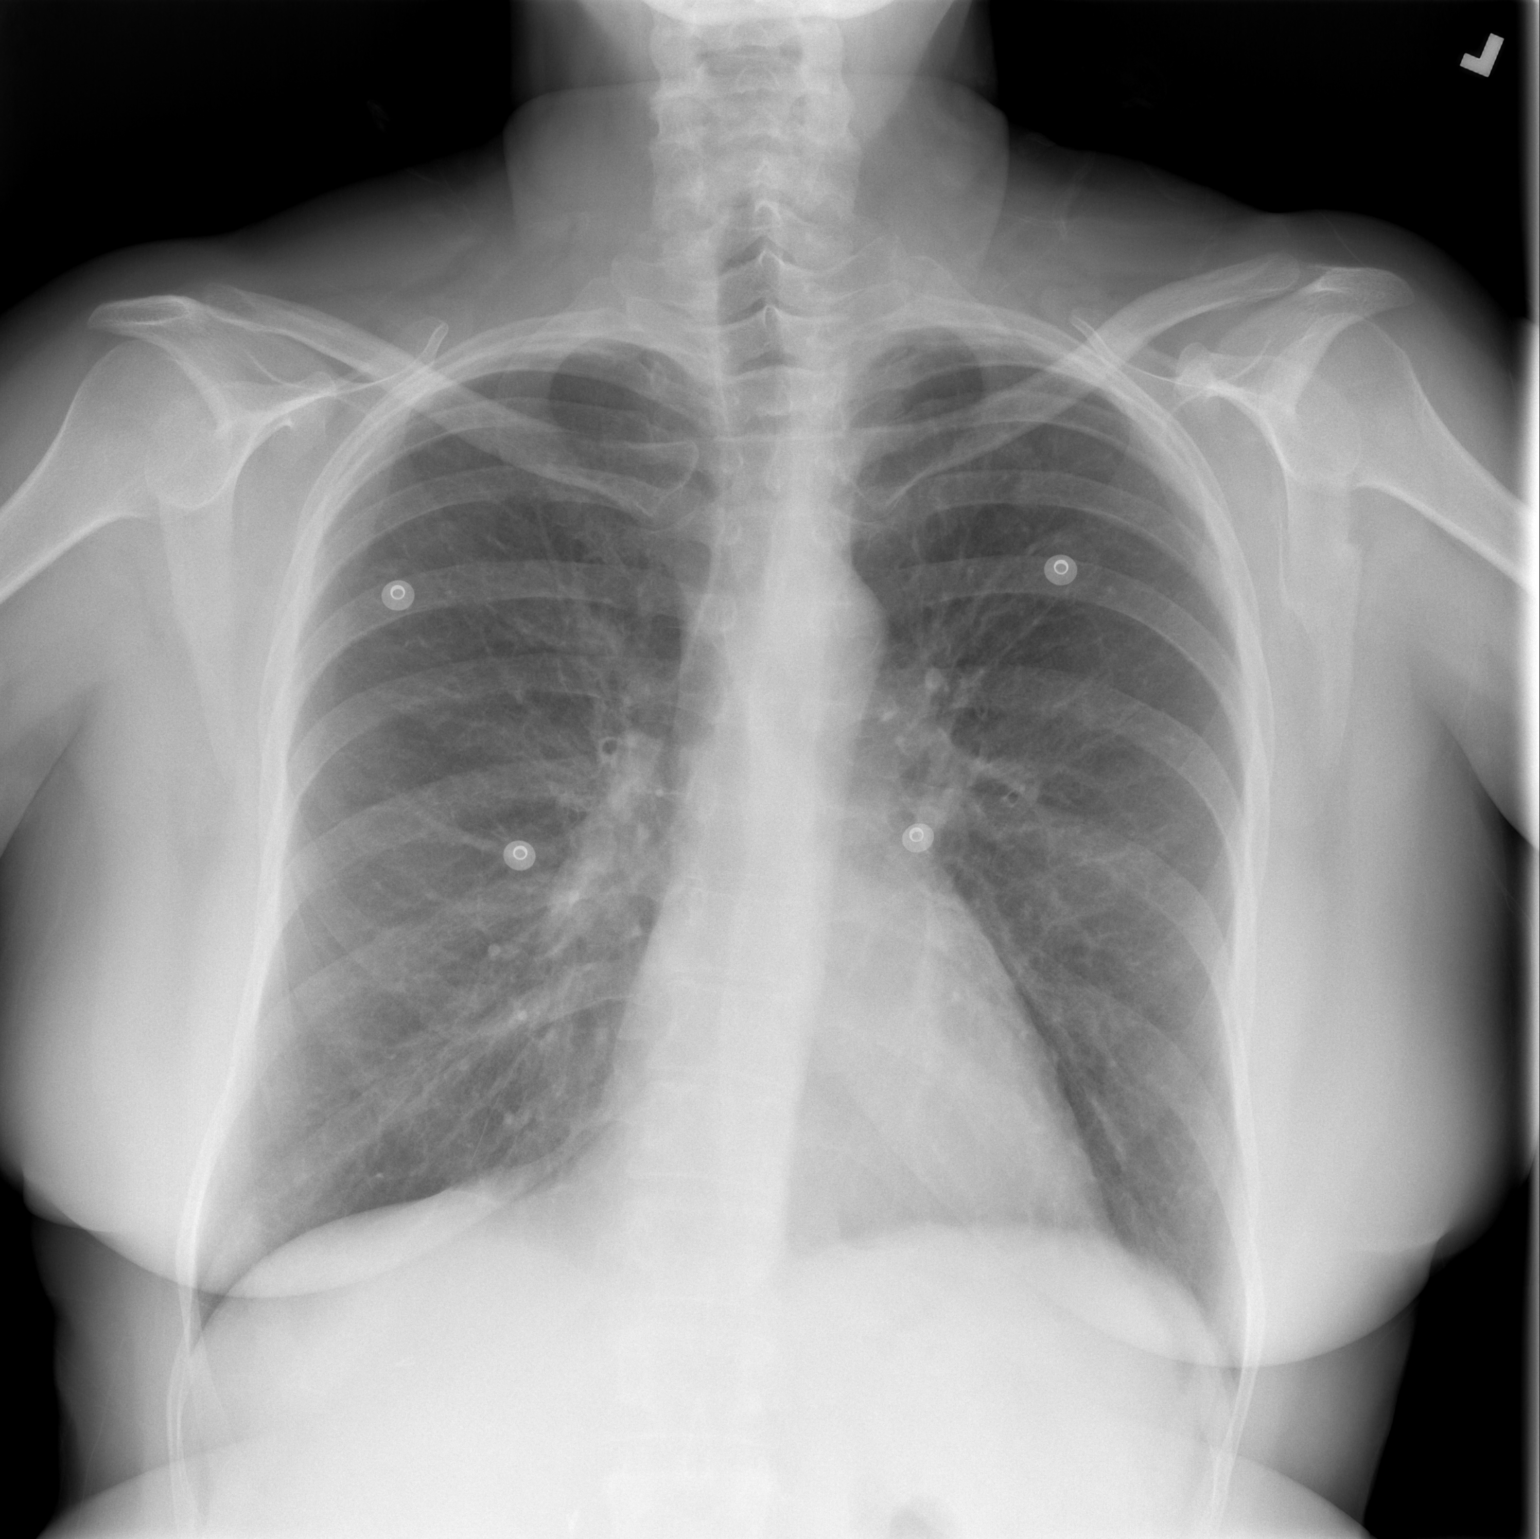

[w chest lat]
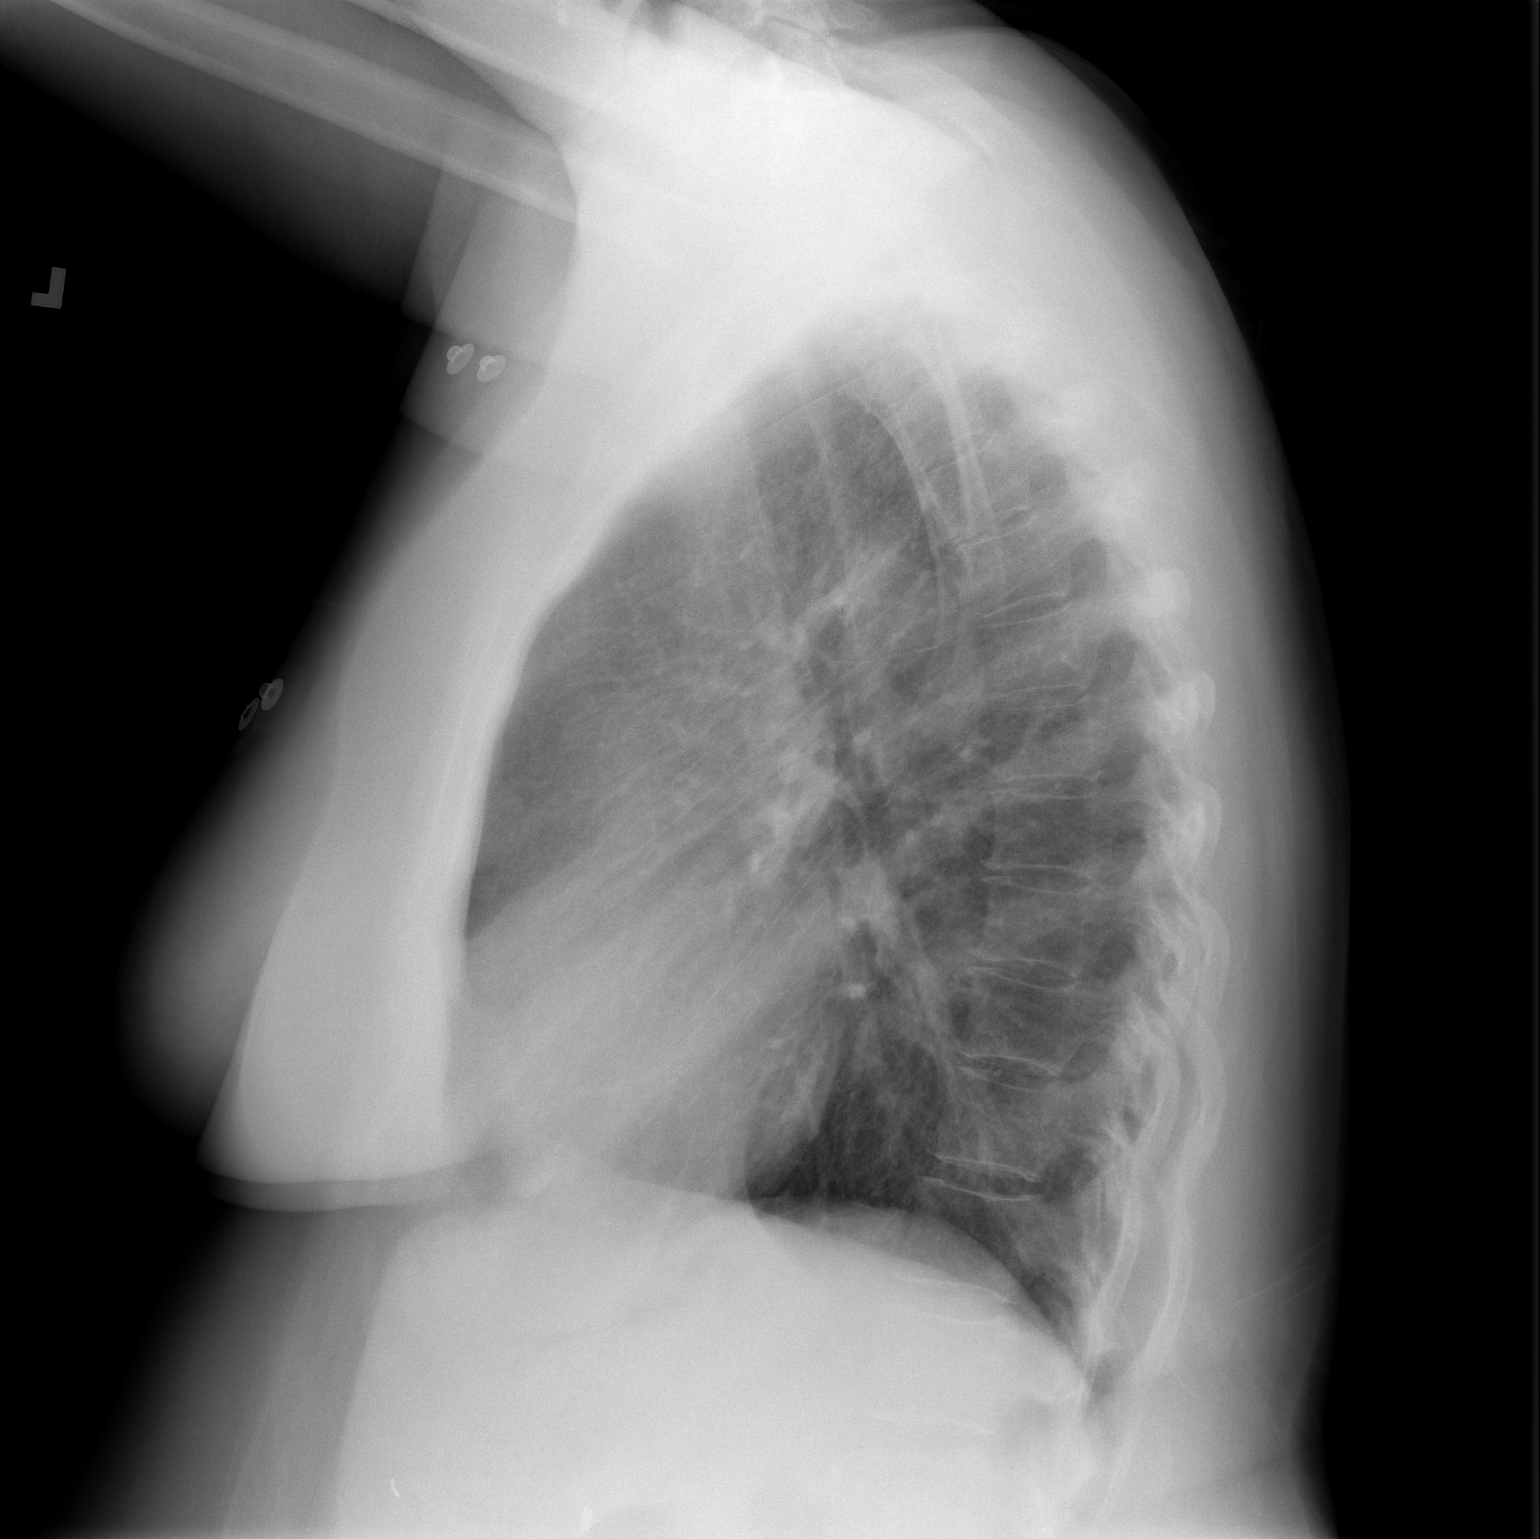

[2 of 2 positions shown; findings below may reference images not displayed]

FINDINGS: The heart size and mediastinal contours are within normal limits.
Both lungs are clear. The visualized skeletal structures are
unremarkable.
IMPRESSION: No active cardiopulmonary disease.
